# Patient Record
Sex: Male | Born: 1945 | Race: White | Hispanic: No | Marital: Married | Smoking: Former smoker
Health system: Southern US, Community
[De-identification: ages and names within clinical notes are randomized; demographics above are authoritative.]

## PROBLEM LIST (undated history)

## (undated) DIAGNOSIS — E119 Type 2 diabetes mellitus without complications: Secondary | ICD-10-CM

## (undated) DIAGNOSIS — M109 Gout, unspecified: Secondary | ICD-10-CM

## (undated) DIAGNOSIS — I10 Essential (primary) hypertension: Secondary | ICD-10-CM

## (undated) DIAGNOSIS — F419 Anxiety disorder, unspecified: Secondary | ICD-10-CM

## (undated) DIAGNOSIS — K219 Gastro-esophageal reflux disease without esophagitis: Secondary | ICD-10-CM

## (undated) DIAGNOSIS — E7800 Pure hypercholesterolemia, unspecified: Secondary | ICD-10-CM

## (undated) DIAGNOSIS — I219 Acute myocardial infarction, unspecified: Secondary | ICD-10-CM

## (undated) DIAGNOSIS — C801 Malignant (primary) neoplasm, unspecified: Secondary | ICD-10-CM

## (undated) DIAGNOSIS — K30 Functional dyspepsia: Secondary | ICD-10-CM

## (undated) DIAGNOSIS — K449 Diaphragmatic hernia without obstruction or gangrene: Secondary | ICD-10-CM

## (undated) DIAGNOSIS — F32A Depression, unspecified: Secondary | ICD-10-CM

## (undated) DIAGNOSIS — N400 Enlarged prostate without lower urinary tract symptoms: Secondary | ICD-10-CM

## (undated) DIAGNOSIS — Z87442 Personal history of urinary calculi: Secondary | ICD-10-CM

## (undated) HISTORY — DX: Depression, unspecified: F32.A

## (undated) HISTORY — DX: Essential (primary) hypertension: I10

## (undated) HISTORY — DX: Anxiety disorder, unspecified: F41.9

## (undated) HISTORY — DX: Malignant (primary) neoplasm, unspecified: C80.1

## (undated) HISTORY — DX: Diaphragmatic hernia without obstruction or gangrene: K44.9

## (undated) HISTORY — DX: Type 2 diabetes mellitus without complications: E11.9

## (undated) HISTORY — DX: Gout, unspecified: M10.9

## (undated) HISTORY — DX: Pure hypercholesterolemia, unspecified: E78.00

## (undated) HISTORY — DX: Acute myocardial infarction, unspecified: I21.9

## (undated) HISTORY — PX: KIDNEY STONE SURGERY: SHX686

## (undated) HISTORY — DX: Functional dyspepsia: K30

## (undated) HISTORY — DX: Personal history of urinary calculi: Z87.442

## (undated) HISTORY — DX: Benign prostatic hyperplasia without lower urinary tract symptoms: N40.0

## (undated) HISTORY — DX: Gastro-esophageal reflux disease without esophagitis: K21.9

---

## 2003-11-13 ENCOUNTER — Other Ambulatory Visit: Payer: Self-pay

## 2004-02-01 DIAGNOSIS — K30 Functional dyspepsia: Secondary | ICD-10-CM

## 2004-02-01 HISTORY — DX: Functional dyspepsia: K30

## 2004-07-04 ENCOUNTER — Ambulatory Visit: Payer: Self-pay | Admitting: Internal Medicine

## 2004-07-07 ENCOUNTER — Ambulatory Visit: Payer: Self-pay | Admitting: Internal Medicine

## 2004-07-07 ENCOUNTER — Ambulatory Visit (HOSPITAL_COMMUNITY): Admission: RE | Admit: 2004-07-07 | Discharge: 2004-07-07 | Payer: Self-pay | Admitting: Internal Medicine

## 2004-07-07 HISTORY — PX: COLONOSCOPY: SHX174

## 2004-07-07 HISTORY — PX: ESOPHAGOGASTRODUODENOSCOPY: SHX1529

## 2004-07-27 ENCOUNTER — Ambulatory Visit: Payer: Self-pay | Admitting: Internal Medicine

## 2004-10-06 ENCOUNTER — Ambulatory Visit: Payer: Self-pay | Admitting: Internal Medicine

## 2005-06-15 ENCOUNTER — Ambulatory Visit: Payer: Self-pay | Admitting: Internal Medicine

## 2006-06-19 ENCOUNTER — Ambulatory Visit: Payer: Self-pay | Admitting: Internal Medicine

## 2007-03-11 ENCOUNTER — Ambulatory Visit: Payer: Self-pay | Admitting: Internal Medicine

## 2007-03-13 ENCOUNTER — Ambulatory Visit: Payer: Self-pay | Admitting: General Surgery

## 2007-03-18 ENCOUNTER — Ambulatory Visit: Payer: Self-pay | Admitting: General Surgery

## 2007-03-28 ENCOUNTER — Ambulatory Visit: Payer: Self-pay | Admitting: General Surgery

## 2007-03-28 ENCOUNTER — Other Ambulatory Visit: Payer: Self-pay

## 2007-03-29 ENCOUNTER — Ambulatory Visit: Payer: Self-pay | Admitting: General Surgery

## 2007-04-06 ENCOUNTER — Ambulatory Visit: Payer: Self-pay | Admitting: Oncology

## 2007-04-17 ENCOUNTER — Ambulatory Visit: Payer: Self-pay | Admitting: Oncology

## 2007-05-06 ENCOUNTER — Ambulatory Visit: Payer: Self-pay | Admitting: Oncology

## 2007-06-06 ENCOUNTER — Ambulatory Visit: Payer: Self-pay | Admitting: Oncology

## 2007-07-07 ENCOUNTER — Ambulatory Visit: Payer: Self-pay | Admitting: Oncology

## 2007-08-04 ENCOUNTER — Ambulatory Visit: Payer: Self-pay | Admitting: Oncology

## 2007-09-04 ENCOUNTER — Ambulatory Visit: Payer: Self-pay | Admitting: Oncology

## 2007-09-16 ENCOUNTER — Inpatient Hospital Stay: Payer: Self-pay | Admitting: Unknown Physician Specialty

## 2007-10-04 ENCOUNTER — Ambulatory Visit: Payer: Self-pay | Admitting: Oncology

## 2007-12-04 ENCOUNTER — Ambulatory Visit: Payer: Self-pay | Admitting: Oncology

## 2007-12-12 ENCOUNTER — Ambulatory Visit: Payer: Self-pay | Admitting: Oncology

## 2007-12-25 ENCOUNTER — Ambulatory Visit: Payer: Self-pay | Admitting: Oncology

## 2008-01-04 ENCOUNTER — Ambulatory Visit: Payer: Self-pay | Admitting: Oncology

## 2008-03-05 ENCOUNTER — Ambulatory Visit: Payer: Self-pay | Admitting: Oncology

## 2008-03-09 IMAGING — NM NM  CARDIAC MUGA REST SCAN 2 0F 2
4 series · 24 of 24 positions shown · non-contrast
Comparison: none

REASON FOR EXAM: lymphoma   pre chemo
COMMENTS:

[Series 1000: lao 70-gated · 6.59mm/px · 6 of 24 frames shown]
[frame 3/24]
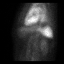
[frame 7/24]
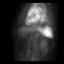
[frame 11/24]
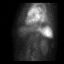
[frame 15/24]
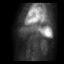
[frame 19/24]
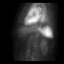
[frame 23/24]
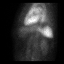

[Series 1000: lao 45-gated · 6.59mm/px · 6 of 24 frames shown]
[frame 3/24]
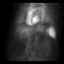
[frame 7/24]
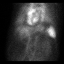
[frame 11/24]
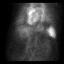
[frame 15/24]
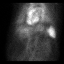
[frame 19/24]
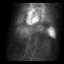
[frame 23/24]
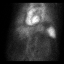

[Series 1000: lao 45-gated (results) · 6.59mm/px · 6 of 24 frames shown]
[frame 3/24]
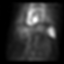
[frame 7/24]
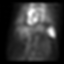
[frame 11/24]
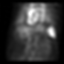
[frame 15/24]
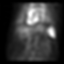
[frame 19/24]
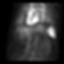
[frame 23/24]
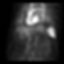

[Series 1000: ant-gated · 6.59mm/px · 6 of 24 frames shown]
[frame 3/24]
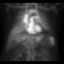
[frame 7/24]
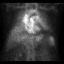
[frame 11/24]
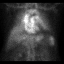
[frame 15/24]
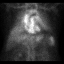
[frame 19/24]
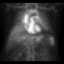
[frame 23/24]
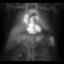

[24 of 24 positions shown; findings below may reference images not displayed]

PROCEDURE:     NM  - NM REST MUGA SCAN [DATE] OF [DATE]  - [DATE] [DATE] [DATE]  [DATE]

RESULT:     Following intravenous administration of 3.0 milliliters PYP and
25.14 mCi Tc 99m pertechnetate, Rest MUGA scan was performed. Review of the
LEFT ventricular cine loop shows no akinetic or dyskinetic myocardial
segments. The LEFT ventricular ejection fraction measures 59.1% which is in
the normal range.
IMPRESSION: 1.     Normal study. The LEFT ventricular ejection fraction measures 59.1%.

## 2008-04-02 ENCOUNTER — Ambulatory Visit: Payer: Self-pay | Admitting: Oncology

## 2008-04-05 ENCOUNTER — Ambulatory Visit: Payer: Self-pay | Admitting: Oncology

## 2008-06-05 ENCOUNTER — Ambulatory Visit: Payer: Self-pay | Admitting: Oncology

## 2008-06-26 ENCOUNTER — Ambulatory Visit: Payer: Self-pay | Admitting: Oncology

## 2008-07-02 ENCOUNTER — Ambulatory Visit: Payer: Self-pay | Admitting: Oncology

## 2008-07-06 ENCOUNTER — Ambulatory Visit: Payer: Self-pay | Admitting: Oncology

## 2008-12-03 ENCOUNTER — Ambulatory Visit: Payer: Self-pay | Admitting: Oncology

## 2008-12-11 ENCOUNTER — Ambulatory Visit: Payer: Self-pay | Admitting: Oncology

## 2008-12-16 ENCOUNTER — Ambulatory Visit: Payer: Self-pay | Admitting: Oncology

## 2009-01-03 ENCOUNTER — Ambulatory Visit: Payer: Self-pay | Admitting: Oncology

## 2010-01-11 ENCOUNTER — Emergency Department: Payer: Self-pay | Admitting: Emergency Medicine

## 2010-06-09 ENCOUNTER — Ambulatory Visit: Payer: Self-pay | Admitting: Endocrinology

## 2011-06-06 HISTORY — PX: STAPLE HEMORRHOIDECTOMY: SHX2438

## 2011-11-17 ENCOUNTER — Ambulatory Visit: Payer: Self-pay | Admitting: Endocrinology

## 2012-01-12 ENCOUNTER — Encounter: Payer: Self-pay | Admitting: Internal Medicine

## 2012-01-15 ENCOUNTER — Ambulatory Visit (INDEPENDENT_AMBULATORY_CARE_PROVIDER_SITE_OTHER): Payer: BC Managed Care – PPO | Admitting: Gastroenterology

## 2012-01-15 ENCOUNTER — Encounter: Payer: Self-pay | Admitting: Gastroenterology

## 2012-01-15 VITALS — BP 123/76 | HR 67 | Temp 98.6°F | Ht 73.0 in | Wt 179.0 lb

## 2012-01-15 DIAGNOSIS — K900 Celiac disease: Secondary | ICD-10-CM

## 2012-01-15 DIAGNOSIS — K219 Gastro-esophageal reflux disease without esophagitis: Secondary | ICD-10-CM

## 2012-01-15 DIAGNOSIS — R1310 Dysphagia, unspecified: Secondary | ICD-10-CM

## 2012-01-15 MED ORDER — PEG 3350-KCL-NA BICARB-NACL 420 G PO SOLR: 4000.0000 L | ORAL | Status: AC

## 2012-01-15 MED ORDER — DICYCLOMINE HCL 10 MG PO CAPS: 10.0000 mg | ORAL_CAPSULE | Freq: Three times a day (TID) | ORAL | Status: DC

## 2012-01-15 NOTE — Progress Notes (Signed)
Primary Care Physician:  Alan Mulder, MD Primary Gastroenterologist:  Dr. Jena Gauss  Chief Complaint  Patient presents with  . Diarrhea  . 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 Last seen in 2008. Last colonoscopy 2006.  Has flare ups of diarrhea. Monday, Tues, Wed. Second day yellow, first day soft, third soft then followed by pure water. Drives an hour to work. +urgency. +postprandial. Imodium makes feel like zombie. Notes spot RLQ, RUQ, epigasric, LUQ that can feel movement. "aware" of it. 4 years ago diagnosed with some type of cancer. Last 4 days done great. +reflux, new, has "sore stomach", feels in mouth and throat. Started taking Pepto a few days ago. Upper abdomen feels irritated. +dysphagia with food and pills. X 1 year. Hx of EGD with empiric dilation 2006.   No blood in stool. Trying to follow gluten free as much as possible. Not frequent diarrhea until last week. No recent abx. No changes in medication. Blood work done 2 weeks ago with Dr. Patrecia Pace. Doesn't eat a lot when working, only eats jello because afraid of what is going to happen with bowel habits. 1 drink a day at work, 1 bag of potato chips. No blood in stool. At home beans, baked potatoes. After had cancer, had lost interest in everything. Lost wt from 224 to 179. Over time. Used to weigh 300 lbs. States r/t diarrhea.   Past Medical History  Diagnosis Date  . HTN (hypertension)   . Hypercholesterolemia   . DM (diabetes mellitus)     resolved  . GERD (gastroesophageal reflux disease)   . BPH (benign prostatic hyperplasia)   . History of kidney stones   . Gouty arthritis   . Hiatal hernia   . 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 Heart attack   . Cancer     Past Surgical History  Procedure Date  . Esophagogastroduodenoscopy  07/07/04    normal/small HH/otherwise normal  . Colonoscopy 07/07/04    normal/left-side diverticula  . Kidney stone surgery     Current Outpatient Prescriptions  Medication Sig Dispense Refill  . aspirin 81 MG tablet Take 81 mg by mouth daily.      . clonazePAM (KLONOPIN) 0.5 MG tablet Take 0.5 mg by mouth at bedtime as needed.       . clopidogrel (PLAVIX) 75 MG tablet Take 75 mg by mouth daily.       Marland Kitchen esomeprazole (NEXIUM) 40 MG capsule Take 40 mg by mouth daily before breakfast.      . folic acid (FOLVITE) 1 MG tablet Take 1 mg by mouth daily.       . meloxicam (MOBIC) 7.5 MG tablet Take 7.5 mg by mouth daily.       . metoprolol succinate (TOPROL-XL) 50 MG 24 hr tablet Take 25 mg by mouth daily.       . ranitidine (ZANTAC) 150 MG capsule Take 150 mg by mouth 2 (two) times daily.      . Tamsulosin HCl (FLOMAX) 0.4 MG CAPS Take 0.4 mg by mouth daily.       . Vitamin D, Ergocalciferol, (DRISDOL) 50000 UNITS CAPS Take 50,000 Units by mouth every 7 (seven) days.       Marland Kitchen dicyclomine (BENTYL) 10 MG capsule Take 1 capsule (10 mg total) by mouth 4 (four)  times daily -  before meals and at bedtime.  90 capsule  3  . polyethylene glycol-electrolytes (TRILYTE) 420 G solution Take 4,000,000 mLs by mouth as directed.  4000 mL  0    Allergies as of 01/15/2012  . (No Known Allergies)    Family History  Problem Relation Age of Onset  . Colon cancer Neg Hx     History   Social History  . Marital Status: Married    Spouse Name: N/A    Number of Children: N/A  . Years of Education: N/A   Occupational History  . [employer]   Social History Main Topics  . Smoking status: Never Smoker   . Smokeless tobacco: Not on file  . Alcohol Use: No  . Drug Use: No  . Sexually Active: Not on file   Other Topics Concern  . Not on file   Social History Narrative  . No narrative on file    Review of Systems: Gen: SEE HPI CV: Denies chest pain, heart palpitations, peripheral  edema, syncope.  Resp: Denies shortness of breath at rest or with exertion. Denies wheezing or cough.  GI: SEE HPI GU : Denies urinary burning, urinary frequency, urinary hesitancy MS: Denies joint pain, muscle weakness, cramps, or limitation of movement.  Derm: Denies rash, itching, dry skin Psych: Denies depression, anxiety, memory loss, and confusion Heme: Denies bruising, bleeding, and enlarged lymph nodes.  Physical Exam: BP 123/76  Pulse 67  Temp 98.6 F (37 C) (Temporal)  Ht 6\' 1"  (1.854 m)  Wt 179 lb (81.194 kg)  BMI 23.62 kg/m2 General:   Alert and oriented. Pleasant and cooperative. Well-nourished and well-developed.  Head:  Normocephalic and atraumatic. Eyes:  Without icterus, sclera clear and conjunctiva pink.  Ears:  Normal auditory acuity. Nose:  No deformity, discharge,  or lesions. Mouth:  No deformity or lesions, oral mucosa pink.  Neck:  Supple, without mass or thyromegaly. Lungs:  Clear to auscultation bilaterally. No wheezes, rales, or rhonchi. No distress.  Heart:  S1, S2 present without murmurs appreciated.  Abdomen:  +BS, soft, non-tender and non-distended. No HSM noted. No guarding or rebound. No masses appreciated.  Rectal:  Deferred  Msk:  Symmetrical without gross deformities. Normal posture. Extremities:  Without clubbing or edema. Neurologic:  Alert and  oriented x4;  grossly normal neurologically. Skin:  Intact without significant lesions or rashes. Cervical Nodes:  No significant cervical adenopathy. Psych:  Alert and cooperative. Normal mood and affect.

## 2012-01-15 NOTE — Patient Instructions (Addendum)
Restart Nexium. I have also sent a prescription for Bentyl to take before meals and at bedtime as needed for spasms, loose stools.   Please complete the stool sample and return when/if possible  Please fax the lab results to your office to fax# [phone]. We will likely need to do some more blood work.    You have been set up for an upper endoscopy with Dr. Jena Gauss. We are tentatively planning on a colonoscopy as well. I will let you know as soon as the blood work is completed.

## 2012-01-16 ENCOUNTER — Encounter: Payer: BC Managed Care – PPO | Admitting: Gastroenterology

## 2012-01-16 ENCOUNTER — Encounter: Payer: Self-pay | Admitting: Internal Medicine

## 2012-01-16 ENCOUNTER — Other Ambulatory Visit: Payer: Self-pay | Admitting: Internal Medicine

## 2012-01-16 DIAGNOSIS — R197 Diarrhea, unspecified: Secondary | ICD-10-CM

## 2012-01-16 DIAGNOSIS — K900 Celiac disease: Secondary | ICD-10-CM

## 2012-01-17 ENCOUNTER — Telehealth: Payer: Self-pay | Admitting: Gastroenterology

## 2012-01-17 ENCOUNTER — Encounter: Payer: Self-pay | Admitting: Gastroenterology

## 2012-01-17 DIAGNOSIS — R1310 Dysphagia, unspecified: Secondary | ICD-10-CM | POA: Insufficient documentation

## 2012-01-17 DIAGNOSIS — K900 Celiac disease: Secondary | ICD-10-CM | POA: Insufficient documentation

## 2012-01-17 DIAGNOSIS — K219 Gastro-esophageal reflux disease without esophagitis: Secondary | ICD-10-CM | POA: Insufficient documentation

## 2012-01-17 NOTE — Assessment & Plan Note (Signed)
Reflux, "sour stomach", dyspepsia and dysphagia. Remote hx of EGD in 2006 with empiric dilation. No PPI currently. Symptoms X 1 year. Restart Nexium, plan on EGD at time of TCS, dilation as appropriate.   Proceed with upper endoscopy and dilation, small bowel biopsies as well in the near future with Dr. Jena Gauss. The risks, benefits, and alternatives have been discussed in detail with patient. They have stated understanding and desire to proceed.  Resume Nexium

## 2012-01-17 NOTE — Telephone Encounter (Signed)
Please let pt know we definitely DO need to proceed with a colonoscopy.  I had said tentative, but I'd like this done at time of EGD as well. Thanks!

## 2012-01-17 NOTE — Assessment & Plan Note (Addendum)
66 year old male with presumed celiac disease based on positive serologies but no prior small bowel biopsies, now with flares of diarrhea, postprandial urgency, abdominal pain despite reportedly strictly adhering to gluten-free diet. Denies abx, recent changes to medication. Decreased po intake due to fear of diarrhea. Wt 224 in 2008, now 179. Last colonoscopy 2006, normal. Diagnosed with some type of cancer about 4 years ago; will need to request notes from PCP and most recent labs. Due to supposed adherence to diet yet continued diarrhea, will need upper endoscopy with small bowel biopsies and colonoscopy for further evaluation; may ultimately need small bowel imaging as well.   Proceed with TCS and endoscopy with Dr. Jena Gauss in near future: the risks, benefits, and alternatives have been discussed with the patient in detail. The patient states understanding and desires to proceed. Restart Bentyl; pt has been on this in remote past Obtain labs from PCP DEXA scan if not up-to-date Anticipate further labs after review from PCP

## 2012-01-17 NOTE — Assessment & Plan Note (Signed)
Last dilation 2006. EGD at time of TCS.

## 2012-01-18 ENCOUNTER — Other Ambulatory Visit: Payer: Self-pay | Admitting: Internal Medicine

## 2012-01-18 ENCOUNTER — Telehealth: Payer: Self-pay | Admitting: *Deleted

## 2012-01-18 DIAGNOSIS — K900 Celiac disease: Secondary | ICD-10-CM

## 2012-01-18 DIAGNOSIS — R1310 Dysphagia, unspecified: Secondary | ICD-10-CM

## 2012-01-18 NOTE — Telephone Encounter (Signed)
Thomas Monroe called today for Thomas Monroe, returning a call. She will be available after 4 pm at her home number for you to call her back on. Thanks.

## 2012-01-18 NOTE — Progress Notes (Signed)
Faxed to PCP

## 2012-01-18 NOTE — Telephone Encounter (Signed)
Forward to [employer]. To get him set up

## 2012-01-19 NOTE — Telephone Encounter (Signed)
Done

## 2012-01-30 ENCOUNTER — Telehealth: Payer: Self-pay | Admitting: Internal Medicine

## 2012-01-30 NOTE — Telephone Encounter (Signed)
Disregard- ERROR

## 2012-01-30 NOTE — Telephone Encounter (Signed)
Patient had to cancel his procedure w/RMR he is having hemorrhoid surgery by the surgeon in [facility] Dr. Burton Apley

## 2012-01-31 ENCOUNTER — Ambulatory Visit: Payer: Self-pay | Admitting: General Surgery

## 2012-01-31 LAB — CBC WITH DIFFERENTIAL/PLATELET
Basophil %: 0.5 %
Eosinophil %: 2 %
HCT: 39.7 % — ABNORMAL LOW (ref 40.0–52.0)
Lymphocyte #: 1.4 10*3/uL (ref 1.0–3.6)
MCH: 29.9 pg (ref 26.0–34.0)
MCV: 91 fL (ref 80–100)
Monocyte %: 6.1 %
Neutrophil #: 5.3 10*3/uL (ref 1.4–6.5)
RBC: 4.34 10*6/uL — ABNORMAL LOW (ref 4.40–5.90)
WBC: 7.3 10*3/uL (ref 3.8–10.6)

## 2012-01-31 LAB — BASIC METABOLIC PANEL
Anion Gap: 8 (ref 7–16)
BUN: 16 mg/dL (ref 7–18)
Creatinine: 0.98 mg/dL (ref 0.60–1.30)
EGFR (African American): >60
EGFR (Non-African Amer.): >60
Glucose: 90 mg/dL (ref 65–99)
Osmolality: 278 (ref 275–301)

## 2012-02-06 ENCOUNTER — Ambulatory Visit: Payer: Self-pay | Admitting: General Surgery

## 2012-02-09 LAB — PATHOLOGY REPORT

## 2012-02-12 ENCOUNTER — Encounter (HOSPITAL_COMMUNITY): Admission: RE | Payer: Self-pay | Source: Ambulatory Visit

## 2012-02-12 ENCOUNTER — Ambulatory Visit (HOSPITAL_COMMUNITY)
Admission: RE | Admit: 2012-02-12 | Payer: BC Managed Care – PPO | Source: Ambulatory Visit | Admitting: Internal Medicine

## 2012-02-12 SURGERY — COLONOSCOPY
Anesthesia: Moderate Sedation

## 2012-02-12 SURGERY — ESOPHAGOGASTRODUODENOSCOPY (EGD) WITH ESOPHAGEAL DILATION
Anesthesia: Moderate Sedation

## 2012-05-22 ENCOUNTER — Emergency Department: Payer: Self-pay | Admitting: Emergency Medicine

## 2012-09-04 ENCOUNTER — Observation Stay: Payer: Self-pay | Admitting: Internal Medicine

## 2012-09-04 LAB — CBC
HGB: 12.5 g/dL — ABNORMAL LOW (ref 13.0–18.0)
MCH: 30 pg (ref 26.0–34.0)
MCHC: 32.6 g/dL (ref 32.0–36.0)
RBC: 4.17 10*6/uL — ABNORMAL LOW (ref 4.40–5.90)
RDW: 13.8 % (ref 11.5–14.5)
WBC: 9.8 10*3/uL (ref 3.8–10.6)

## 2012-09-04 LAB — URINALYSIS, COMPLETE
Bacteria: NONE SEEN
Ketone: NEGATIVE
Leukocyte Esterase: NEGATIVE
RBC,UR: <1 /HPF (ref 0–5)
Squamous Epithelial: NONE SEEN

## 2012-09-04 LAB — DRUG SCREEN, URINE
Benzodiazepine, Ur Scrn: NEGATIVE (ref ?–200)
Cannabinoid 50 Ng, Ur ~~LOC~~: NEGATIVE (ref ?–50)
Cocaine Metabolite,Ur ~~LOC~~: NEGATIVE (ref ?–300)
MDMA (Ecstasy)Ur Screen: NEGATIVE (ref ?–500)
Opiate, Ur Screen: NEGATIVE (ref ?–300)
Tricyclic, Ur Screen: NEGATIVE (ref ?–1000)

## 2012-09-04 LAB — CK TOTAL AND CKMB (NOT AT ARMC)
CK, Total: 71 U/L (ref 35–232)
CK-MB: <0.5 ng/mL — ABNORMAL LOW (ref 0.5–3.6)

## 2012-09-04 LAB — BASIC METABOLIC PANEL
Calcium, Total: 8 mg/dL — ABNORMAL LOW (ref 8.5–10.1)
Creatinine: 1.05 mg/dL (ref 0.60–1.30)
EGFR (African American): >60
EGFR (Non-African Amer.): >60
Osmolality: 281 (ref 275–301)
Potassium: 4.5 mmol/L (ref 3.5–5.1)

## 2012-09-04 LAB — PROTIME-INR
INR: 1.2
Prothrombin Time: 15.4 secs — ABNORMAL HIGH (ref 11.5–14.7)

## 2012-09-04 LAB — TROPONIN I: Troponin-I: <0.02 ng/mL

## 2012-09-05 DIAGNOSIS — R079 Chest pain, unspecified: Secondary | ICD-10-CM

## 2012-09-05 LAB — CK TOTAL AND CKMB (NOT AT ARMC)
CK, Total: 65 U/L (ref 35–232)
CK-MB: <0.5 ng/mL — ABNORMAL LOW (ref 0.5–3.6)

## 2012-09-05 LAB — TROPONIN I: Troponin-I: <0.02 ng/mL

## 2013-02-27 NOTE — Progress Notes (Signed)
Patient cancelled procedures. Please send letter for follow-up.

## 2013-02-28 ENCOUNTER — Encounter: Payer: Self-pay | Admitting: Internal Medicine

## 2013-02-28 NOTE — Progress Notes (Signed)
Mailed letter °

## 2014-04-24 ENCOUNTER — Ambulatory Visit: Payer: Self-pay | Admitting: Endocrinology

## 2014-09-22 NOTE — Op Note (Signed)
PATIENT NAME:  Thomas Monroe, Thomas Monroe MR#:  [id number] DATE OF BIRTH:  12-25-1945  DATE OF PROCEDURE:  02/06/2012  PREOPERATIVE DIAGNOSIS: Prolapsing hemorrhoids.   POSTOPERATIVE DIAGNOSIS: Prolapsing hemorrhoids.  PROCEDURES:  1. Colonoscopy.  2. Internal hemorrhoidectomy.   SURGEON: Mckinley Jewel, M.D.   ANESTHESIA: General.   COMPLICATIONS: None.   ESTIMATED BLOOD LOSS: Less than 20 mL.   DRAINS: None.   CLINICAL NOTE: This patient had presented with complaints of a significant amount of tissue prolapsing every time he moved his bowels. Although this was reported by him on two visits to the office I was not able to detect any prolapsing tissue. He did have a couple of small internal hemorrhoids that were visible. We decided therefore to proceed with exam under anesthesia. In addition, the patient had not had a screening colonoscopy and this was planned to be done at the same time.   DESCRIPTION OF PROCEDURE: The patient was given propofol anesthesia  after placing him in the left lateral position. The colonoscope was then introduced into the rectum and advanced gradually through the sigmoid and into the descending colon and subsequently in the transverse and right colon. The patient had a fairly decent prep extending up and to the distal transverse colon, but from there on he seemed to have a moderate coating of stool which could not be easily washed out. This was all the way down to the cecal area. The findings included a 3 mm sessile polyp in the mid rectal region which was hot biopsied, also multiple large and small diverticula were noted in the sigmoid colon. No gross lesions suspicious of cancer or growth or mass was identified, although with the poor prep in the proximal half of the colon small lesions could have been missed. The scope was withdrawn.  The patient was then placed in the lithotomy position with a LMA in place at this time. It was noted that the patient had significant prolapse  of the skin around the anal area. The anal area was prepped and draped out as a sterile field and further evaluation was done both with digital examination and with anal speculum. It was noted there was a significant weakness of the internal sphincter which seemed to widen more than two fingerbreadths in size and it appeared that the tendency for prolapse was not only the mucosa but may even involved part of the rectal muscle. With this in mind, a stapling technique was not utilized. The redundant mucosa was inspected and noted there were in fact two hemorrhoids, one posteriorly and one anteriorly. These were suture ligated with 2-0 Vicryl, 3-0 Vicryl, and removed. The area was covered with bacitracin ointment. The patient subsequently was taken down from lithotomy position and subsequently reversed and returned to the recovery room in stable condition.  ____________________________ Thomas Haines, Thomas Monroe sgs:slb D: 02/06/2012 15:40:27 ET T: 02/06/2012 16:38:42 ET JOB#: [id number]  cc: S.G. Jamal Collin, Thomas Monroe, <Dictator> [facility] Robinette Haines Thomas Monroe ELECTRONICALLY SIGNED 02/07/2012 7:49

## 2014-09-25 NOTE — H&P (Signed)
PATIENT NAME:  Thomas Monroe, Thomas Monroe MR#:  [id number] DATE OF BIRTH:  1945-11-21  DATE OF ADMISSION:  09/04/2012  PRIMARY CARE PHYSICIAN: Dr. Ronnald Collum.   CHIEF COMPLAINT: Chest pain.   HISTORY OF PRESENT ILLNESS:  A 69 year old male patient with history of coronary artery disease, diabetes, hypertension, presents to the Emergency Room with complaints of acute onset of chest pain while driving. The patient had midsternal chest pain lasting about 25 minutes along with associated vague heaviness in his neck and bilateral arms. This was associated with nausea and palpitations. No shortness of breath, lightheadedness, syncope. Never had similar symptoms in the past. He had a stress test 2 years prior.   The patient's EKG shows no acute changes. Cardiac enzymes are normal. Presently, he is chest pain-free and is being admitted to the hospitalist service to rule out acute coronary syndrome.   PAST MEDICAL HISTORY:  Depression, anxiety, hypertension, diabetes, coronary artery disease.   SOCIAL HISTORY: The patient does not smoke. No alcohol. No illicit drugs. Works in [place] [employer].   CODE STATUS: Full code.   FAMILY HISTORY: No premature coronary artery disease.   ALLERGIES: BARLEY, RYE AND WHEAT.  HOME MEDICATIONS: 1.  Aspirin 81 mg oral once a day.  2.  Plavix 75 mg oral once a day.  4.  Folic acid 1 mg oral once a day.  5.  Clonazepam 0.5 mg oral 3 times a day as needed for anxiety.  6.  Folic acid 1 mg oral once a day.  7.  Vitamin D3 1000 international units oral once a day.   REVIEW OF SYSTEMS: CONSTITUTIONAL: No fever, fatigue, weakness, weight loss, weight gain.  EYES: No blurred vision, pain, or redness.  ENT: No tinnitus, ear pain, hearing loss.  RESPIRATORY: No cough, shortness of breath, wheezing, hemoptysis.  GASTROINTESTINAL: No nausea, vomiting, diarrhea, abdominal pain.  CARDIOVASCULAR: Has chest pain, no orthopnea or edema.  GENITOURINARY: No dysuria, frequency, hematuria.   MUSCULOSKELETAL: No joint swelling, redness.  SKIN: No petechiae, rash or ulcers.  NEUROLOGIC: No focal numbness, weakness or seizures.  PSYCHIATRIC: Has anxiety, depression.   PHYSICAL EXAMINATION: VITAL SIGNS: Temperature 98.1, pulse 64, respirations 18, blood pressure 139/84, saturating 99% on room air.  GENERAL: Obese, Caucasian male patient lying in bed, comfortable, conversational, cooperative with exam.  PSYCHIATRIC: Alert, oriented x 3. Mood and affect appropriate. Judgment intact.  HEENT: Atraumatic, normocephalic. Oral mucosa moist and pink. External ears and nose normal. No pallor or icterus. Pupils bilaterally equal and react to light.  NECK: Supple. No thyromegaly. No palpable lymph nodes. Trachea midline. No carotid bruit or JVD.  CARDIOVASCULAR: S1, S2, regular rate and rhythm without any murmurs. No chest wall tenderness.  RESPIRATORY: Normal work of breathing. Clear to auscultation on both sides.  GASTROINTESTINAL: Soft abdomen, nontender. Bowel sounds present. No hepatosplenomegaly palpable.  GENITOURINARY: No CVA tenderness or bladder distention.  SKIN: Warm and dry. No petechiae, rash, ulcers.  MUSCULOSKELETAL: No joint swelling, redness, effusion of the large joints. Normal muscle tone.  NEUROLOGICAL: Motor strength 5/5 in upper and lower extremities. Sensation to fine touch intact all over.  LYMPHATIC: No cervical lymphadenopathy.   LABORATORY DATA AND IMAGING: 1.  CK, troponin and MB normal. WBC 9.8, hemoglobin 12.5. BUN 13, creatinine 1.05. INR 1.2. Urinalysis shows no bacteria, no WBC.  2.  EKG shows normal sinus rhythm. No acute ST or T wave changes.  3.  Chest x-ray, portable, showed no acute abnormalities.   ASSESSMENT AND PLAN: 1.  Chest pain  with heaviness in bilateral arms, atypical but with history of hypertension, diabetes, coronary artery disease and prior stents. We will admit the patient to rule out acute coronary syndrome. Get 2 more sets of cardiac  enzymes. Schedule for a stress test in morning. The patient does have history of gastroesophageal reflux disease, which could have caused his symptoms if the stress test is negative. If the stress test is positive, the patient will need a repeat cardiac catheterization and cardiology consult. We will continue the aspirin, Plavix, statin, beta blocker the patient is on.  2.  Hypertension. Continue home medication.  3.  Diabetes mellitus. Sliding scale insulin, diabetic diet.  4.  Gastroesophageal reflux disease. Continue proton pump inhibitors.  5.  Deep vein thrombosis prophylaxis with Lovenox.   CODE STATUS: Full code.   TIME SPENT: Time spent today on this case was 65 minutes.   ____________________________ Leia Alf Daymein Nunnery, MD srs:cs D: 09/04/2012 14:58:00 ET T: 09/04/2012 15:18:36 ET JOB#: [id number]  cc: Alveta Heimlich R. Darvin Neighbours, MD, <Dictator> [name], MD  Neita Carp MD ELECTRONICALLY SIGNED 09/09/2012 15:00

## 2014-09-25 NOTE — Discharge Summary (Signed)
PATIENT NAME:  Thomas Monroe, Thomas Monroe MR#:  [id number] DATE OF BIRTH:  [birth year]  DATE OF ADMISSION:  09/04/2012 DATE OF DISCHARGE:  09/05/2012  PRIMARY CARE PHYSICIAN:  Dr. Francoise Schaumann.   DISCHARGE DIAGNOSES:   1.  Gastroesophageal reflux disease.  2.  Coronary artery disease.  3.  Hypertension.   IMAGING STUDIES DONE:  Include a Lexiscan stress test, showed no significant ischemia.  Did show a small region of fixed defect in the inferior, lateral and apical region consistent with old scar or MI.  EF was calculated at 73%.  Was read as overall low-risk scan by Dr. Rockey Situ.   ADMITTING HISTORY, PHYSICAL AND HOSPITAL COURSE:  Please see detailed H and P dictated previously.  In brief, a 69 year old male patient with history of hypertension, diet-controlled diabetes presented to the Emergency Room complaining of chest pain, was admitted to the hospitalist service to rule out acute coronary syndrome and for a stress test.  The patient had three sets of cardiac enzymes which returned in normal range.  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  The patient had his pain likely secondary from the GERD, was started on PPIs twice a day, did not have any further pain and is being discharged home to follow up with his primary care physician.   DISCHARGE MEDICATIONS: 1.  Folic acid 1 mg oral once a day. 2.  Clonazepam 0.5 mg oral 3 times a day as needed.  3.  Plavix 75 mg oral once a day.  4.  Metoprolol succinate 50 mg 1/2 tablet oral once a day.  5.  Vitamin D3 3000 international units oral once a day.  6.  Aspirin 81 mg oral once a day.  7.  Protonix 40 mg oral 2 times a day.  8.  Nitroglycerin 0.4 mg sublingual as needed for chest pain.   DISCHARGE INSTRUCTIONS:  Low-sodium, low carbohydrate diet.  Activity as tolerated.  Follow with PCP Dr. Francoise Schaumann in 1 to 2 weeks.   Time spent on day of discharge, in discharge activity was 32  minutes.    ____________________________ Thomas Alf Lael Pilch, MD srs:ea D: 09/06/2012 15:20:12 ET T: 09/06/2012 23:25:04 ET JOB#: [id number]  cc: Alveta Heimlich R. Markel Mergenthaler, MD, <Dictator> Dr. Audree Bane MD ELECTRONICALLY SIGNED 09/09/2012 15:00

## 2016-05-08 ENCOUNTER — Other Ambulatory Visit: Payer: Self-pay | Admitting: Endocrinology

## 2016-05-08 DIAGNOSIS — I2510 Atherosclerotic heart disease of native coronary artery without angina pectoris: Secondary | ICD-10-CM

## 2016-05-12 ENCOUNTER — Ambulatory Visit
Admission: RE | Admit: 2016-05-12 | Discharge: 2016-05-12 | Disposition: A | Discharge status: Home / Self Care | Payer: Medicare HMO | Source: Ambulatory Visit | Attending: Endocrinology | Admitting: Endocrinology

## 2016-05-12 DIAGNOSIS — E119 Type 2 diabetes mellitus without complications: Secondary | ICD-10-CM | POA: Insufficient documentation

## 2016-05-12 DIAGNOSIS — I10 Essential (primary) hypertension: Secondary | ICD-10-CM | POA: Diagnosis not present

## 2016-05-12 DIAGNOSIS — I081 Rheumatic disorders of both mitral and tricuspid valves: Secondary | ICD-10-CM | POA: Diagnosis not present

## 2016-05-12 DIAGNOSIS — I252 Old myocardial infarction: Secondary | ICD-10-CM | POA: Insufficient documentation

## 2016-05-12 DIAGNOSIS — K219 Gastro-esophageal reflux disease without esophagitis: Secondary | ICD-10-CM | POA: Diagnosis not present

## 2016-05-12 DIAGNOSIS — E7800 Pure hypercholesterolemia, unspecified: Secondary | ICD-10-CM | POA: Insufficient documentation

## 2016-05-12 DIAGNOSIS — I2510 Atherosclerotic heart disease of native coronary artery without angina pectoris: Secondary | ICD-10-CM | POA: Insufficient documentation

## 2016-05-12 DIAGNOSIS — K449 Diaphragmatic hernia without obstruction or gangrene: Secondary | ICD-10-CM | POA: Diagnosis not present

## 2016-05-12 NOTE — Progress Notes (Signed)
*  PRELIMINARY RESULTS* Echocardiogram 2D Echocardiogram has been performed.  Sherrie Sport 05/12/2016, 11:36 AM

## 2016-07-12 ENCOUNTER — Emergency Department: Payer: Medicare HMO

## 2016-07-12 ENCOUNTER — Encounter: Payer: Self-pay | Admitting: Emergency Medicine

## 2016-07-12 DIAGNOSIS — Z7982 Long term (current) use of aspirin: Secondary | ICD-10-CM | POA: Diagnosis not present

## 2016-07-12 DIAGNOSIS — Z79899 Other long term (current) drug therapy: Secondary | ICD-10-CM | POA: Insufficient documentation

## 2016-07-12 DIAGNOSIS — E119 Type 2 diabetes mellitus without complications: Secondary | ICD-10-CM | POA: Insufficient documentation

## 2016-07-12 DIAGNOSIS — I10 Essential (primary) hypertension: Secondary | ICD-10-CM | POA: Diagnosis not present

## 2016-07-12 DIAGNOSIS — Z87891 Personal history of nicotine dependence: Secondary | ICD-10-CM | POA: Diagnosis not present

## 2016-07-12 DIAGNOSIS — R05 Cough: Secondary | ICD-10-CM | POA: Diagnosis present

## 2016-07-12 DIAGNOSIS — J09X2 Influenza due to identified novel influenza A virus with other respiratory manifestations: Secondary | ICD-10-CM | POA: Insufficient documentation

## 2016-07-12 DIAGNOSIS — Z7984 Long term (current) use of oral hypoglycemic drugs: Secondary | ICD-10-CM | POA: Diagnosis not present

## 2016-07-12 LAB — INFLUENZA PANEL BY PCR (TYPE A & B)
INFLAPCR: POSITIVE — AB
INFLBPCR: NEGATIVE

## 2016-07-12 LAB — BASIC METABOLIC PANEL
ANION GAP: 10 (ref 5–15)
BUN: 16 mg/dL (ref 6–20)
CALCIUM: 8.8 mg/dL — AB (ref 8.9–10.3)
CO2: 26 mmol/L (ref 22–32)
Chloride: 98 mmol/L — ABNORMAL LOW (ref 101–111)
Creatinine, Ser: 1.56 mg/dL — ABNORMAL HIGH (ref 0.61–1.24)
GFR, EST AFRICAN AMERICAN: 51 mL/min — AB (ref >60)
GFR, EST NON AFRICAN AMERICAN: 44 mL/min — AB (ref >60)
GLUCOSE: 122 mg/dL — AB (ref 65–99)
Potassium: 4.5 mmol/L (ref 3.5–5.1)
SODIUM: 134 mmol/L — AB (ref 135–145)

## 2016-07-12 LAB — CBC
HCT: 42.6 % (ref 40.0–52.0)
HEMOGLOBIN: 14.2 g/dL (ref 13.0–18.0)
MCH: 29.9 pg (ref 26.0–34.0)
MCHC: 33.2 g/dL (ref 32.0–36.0)
MCV: 90.1 fL (ref 80.0–100.0)
Platelets: 218 10*3/uL (ref 150–440)
RBC: 4.73 MIL/uL (ref 4.40–5.90)
RDW: 13.5 % (ref 11.5–14.5)
WBC: 8.4 10*3/uL (ref 3.8–10.6)

## 2016-07-12 LAB — TROPONIN I

## 2016-07-12 NOTE — ED Triage Notes (Signed)
Pt comes into the ED via POV c/o flu like symptoms and a new onset of chest pain that started today.  Patient states the chest pain is on the right side.  Symptoms also include: cough, congestion, body aches, and headache.  Patient presents in NAD at this time with even and unlabored respirations.  States he has dizziness along with the chest pain.

## 2016-07-13 ENCOUNTER — Emergency Department
Admission: EM | Admit: 2016-07-13 | Discharge: 2016-07-13 | Disposition: A | Discharge status: Home / Self Care | Payer: Medicare HMO | Attending: Emergency Medicine | Admitting: Emergency Medicine

## 2016-07-13 DIAGNOSIS — R059 Cough, unspecified: Secondary | ICD-10-CM

## 2016-07-13 DIAGNOSIS — J101 Influenza due to other identified influenza virus with other respiratory manifestations: Secondary | ICD-10-CM

## 2016-07-13 DIAGNOSIS — R05 Cough: Secondary | ICD-10-CM

## 2016-07-13 DIAGNOSIS — R0789 Other chest pain: Secondary | ICD-10-CM

## 2016-07-13 MED ORDER — HYDROCOD POLST-CPM POLST ER 10-8 MG/5ML PO SUER: 5.0000 mL | Freq: Two times a day (BID) | ORAL | 0 refills | Status: DC

## 2016-07-13 MED ORDER — SODIUM CHLORIDE 0.9 % IV BOLUS (SEPSIS)
1000.0000 mL | Freq: Once | INTRAVENOUS | Status: AC
  Administered 2016-07-13: 1000 mL via INTRAVENOUS

## 2016-07-13 MED ORDER — HYDROCOD POLST-CPM POLST ER 10-8 MG/5ML PO SUER
5.0000 mL | Freq: Once | ORAL | Status: AC
  Administered 2016-07-13: 5 mL via ORAL
  Filled 2016-07-13: qty 5

## 2016-07-13 NOTE — Discharge Instructions (Signed)
1. You may take Tussionex as needed for cough. 2. Drink plenty of fluids daily. 3. Return to the ER for worsening symptoms, persistent vomiting, difficulty breathing or other concerns.

## 2016-07-13 NOTE — ED Provider Notes (Signed)
[facility] Emergency Department Provider Note   ____________________________________________   First MD Initiated Contact with Patient 07/13/16 0030     (approximate)  I have reviewed the triage vital signs and the nursing notes.   HISTORY  Chief Complaint Chest Pain and Generalized Body Aches    HPI Thomas Monroe is a 71 y.o. male who presents to the ED from home with a chief complaint of flulike symptoms. Reports a one-week history of cough productive of yellow sputum, congestion, body aches, headache, sore throat andright-sided chest pain. States he has been taking over-the-counter medications and thought he was improving but felt worse after working today. Denies associated fever, chills, abdominal pain, nausea, vomiting, diarrhea. Denies recent travel or trauma. Nothing makes his symptoms better or worse.   Past Medical History:  Diagnosis Date  . BPH (benign prostatic hyperplasia)   . Cancer ([place])   . 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 DM (diabetes mellitus) ([place])    resolved  . GERD (gastroesophageal reflux disease)   . Gouty arthritis   . Heart attack   . Hiatal hernia   . History of kidney stones   . HTN (hypertension)   . Hypercholesterolemia     Patient Active Problem List   Diagnosis Date Noted  . Celiac disease 01/17/2012  . GERD (gastroesophageal reflux disease) 01/17/2012  . Dysphagia 01/17/2012    Past Surgical History:  Procedure Laterality Date  . COLONOSCOPY  07/07/04   normal/left-side diverticula  . ESOPHAGOGASTRODUODENOSCOPY  07/07/04   normal/small HH/otherwise normal  . KIDNEY STONE SURGERY      Prior to Admission medications   Medication Sig Start Date End Date Taking? Authorizing Provider  aspirin 81 MG tablet Take 81 mg by mouth daily.    Historical Provider, MD  chlorpheniramine-HYDROcodone (TUSSIONEX PENNKINETIC ER) 10-8 MG/5ML SUER Take 5 mLs by mouth 2 (two)  times daily. 07/13/16   Paulette Blanch, MD  clonazePAM (KLONOPIN) 0.5 MG tablet Take 0.5 mg by mouth at bedtime as needed.  12/08/11   Historical Provider, MD  clopidogrel (PLAVIX) 75 MG tablet Take 75 mg by mouth daily.  12/27/11   Historical Provider, MD  dicyclomine (BENTYL) 10 MG capsule Take 1 capsule (10 mg total) by mouth 4 (four) times daily -  before meals and at bedtime. 01/15/12 01/14/13  Annitta Needs, NP  esomeprazole (NEXIUM) 40 MG capsule Take 40 mg by mouth daily before breakfast.    Historical Provider, MD  folic acid (FOLVITE) 1 MG tablet Take 1 mg by mouth daily.  01/05/12   Historical Provider, MD  meloxicam (MOBIC) 7.5 MG tablet Take 7.5 mg by mouth daily.  01/08/12   Historical Provider, MD  metoprolol succinate (TOPROL-XL) 50 MG 24 hr tablet Take 25 mg by mouth daily.  01/05/12   Historical Provider, MD  ranitidine (ZANTAC) 150 MG capsule Take 150 mg by mouth 2 (two) times daily.    Historical Provider, MD  Tamsulosin HCl (FLOMAX) 0.4 MG CAPS Take 0.4 mg by mouth daily.  01/05/12   Historical Provider, MD  Vitamin D, Ergocalciferol, (DRISDOL) 50000 UNITS CAPS Take 50,000 Units by mouth every 7 (seven) days.  01/05/12   Historical Provider, MD    Allergies Klonopin [clonazepam]  Family History  Problem Relation Age of Onset  . Colon cancer Neg Hx     Social History Social History  Substance Use Topics  . Smoking status: Former Research scientist (life sciences)  .  Smokeless tobacco: Never Used  . Alcohol use No    Review of Systems  Constitutional: No fever/chills. Eyes: No visual changes. ENT: Positive for nasal congestion and sore throat. Cardiovascular: Positive for chest pain. Respiratory: Positive for productive cough. Denies shortness of breath. Gastrointestinal: No abdominal pain.  No nausea, no vomiting.  No diarrhea.  No constipation. Genitourinary: Negative for dysuria. Musculoskeletal: Negative for back pain. Skin: Negative for rash. Neurological: Negative for headaches, focal weakness or  numbness.  10-point ROS otherwise negative.  ____________________________________________   PHYSICAL EXAM:  VITAL SIGNS: ED Triage Vitals  Enc Vitals Group     BP 07/12/16 2028 126/72     Pulse Rate 07/12/16 2028 87     Resp 07/12/16 2028 18     Temp 07/12/16 2028 98.5 F (36.9 C)     Temp Source 07/12/16 2028 Oral     SpO2 07/12/16 2028 99 %     Weight 07/12/16 2026 170 lb (77.1 kg)     Height 07/12/16 2026 6\' 1"  (1.854 m)     Head Circumference --      Peak Flow --      Pain Score 07/12/16 2026 6     Pain Loc --      Pain Edu? --      Excl. in [place]? --     Constitutional: Alert and oriented. Well appearing and in no acute distress. Eyes: Conjunctivae are normal. PERRL. EOMI. Head: Atraumatic. Nose: Congestion/rhinnorhea. Mouth/Throat: Mucous membranes are moist.  Oropharynx mildly erythematous without tonsillar swelling, exudates or peritonsillar abscess. Mild postnasal drip noted. There is no hoarse muffled voice. There is no drooling.  Neck: No stridor.  Supple neck without meningismus. Hematological/Lymphatic/Immunilogical: No cervical lymphadenopathy. Cardiovascular: Normal rate, regular rhythm. Grossly normal heart sounds.  Good peripheral circulation. Respiratory: Normal respiratory effort.  No retractions. Lungs CTAB. Right anterior chest wall tender to palpation. Gastrointestinal: Soft and nontender. No distention. No abdominal bruits. No CVA tenderness. Musculoskeletal: No lower extremity tenderness nor edema.  No joint effusions. Neurologic:  Normal speech and language. No gross focal neurologic deficits are appreciated. No gait instability. Skin:  Skin is warm, dry and intact. No rash noted. No petechiae. Psychiatric: Mood and affect are normal. Speech and behavior are normal.  ____________________________________________   LABS (all labs ordered are listed, but only abnormal results are displayed)  Labs Reviewed  BASIC METABOLIC PANEL - Abnormal; Notable  for the following:       Result Value   Sodium 134 (*)    Chloride 98 (*)    Glucose, Bld 122 (*)    Creatinine, Ser 1.56 (*)    Calcium 8.8 (*)    GFR calc non Af Amer 44 (*)    GFR calc Af Amer 51 (*)    All other components within normal limits  INFLUENZA PANEL BY PCR (TYPE A & B) - Abnormal; Notable for the following:    Influenza A By PCR POSITIVE (*)    All other components within normal limits  CBC  TROPONIN I   ____________________________________________  EKG  ED ECG REPORT I, SUNG,JADE J, the attending physician, personally viewed and interpreted this ECG.   Date: 07/13/2016  EKG Time: 2027  Rate: 85  Rhythm: normal EKG, normal sinus rhythm  Axis: LAD  Intervals:none  ST&T Change: Nonspecific  ____________________________________________  RADIOLOGY  Chest 2 view interpreted per Dr. Randel Pigg: No active cardiopulmonary disease. Tiny 3 mm density projecting over  the anterior left seventh rib may  represent a bone island,  potentially overlying subpleural tiny nodule or based on prior CT, a  tiny focus of pleural thickening.    ____________________________________________   PROCEDURES  Procedure(s) performed: None  Procedures  Critical Care performed: No  ____________________________________________   INITIAL IMPRESSION / ASSESSMENT AND PLAN / ED COURSE  Pertinent labs & imaging results that were available during my care of the patient were reviewed by me and considered in my medical decision making (see chart for details).  71 year old male who presents with a one-week history of flulike symptoms and pleurisy. Laboratory imaging results remarkable for renal insufficiency secondary to dehydration and positive influenza A. Will infuse IV fluids, Tussionex for cough and patient will follow-up with his PCP closely. Strict return precautions given. Patient verbalizes understanding and agrees with plan of care.        ____________________________________________   FINAL CLINICAL IMPRESSION(S) / ED DIAGNOSES  Final diagnoses:  Influenza A  Cough  Chest wall pain      NEW MEDICATIONS STARTED DURING THIS VISIT:  New Prescriptions   CHLORPHENIRAMINE-HYDROCODONE (TUSSIONEX PENNKINETIC ER) 10-8 MG/5ML SUER    Take 5 mLs by mouth 2 (two) times daily.     Note:  This document was prepared using Dragon voice recognition software and may include unintentional dictation errors.    Paulette Blanch, MD 07/13/16 [phone]

## 2017-10-28 ENCOUNTER — Encounter: Payer: Self-pay | Admitting: Emergency Medicine

## 2017-10-28 ENCOUNTER — Other Ambulatory Visit: Payer: Self-pay

## 2017-10-28 ENCOUNTER — Emergency Department
Admission: EM | Admit: 2017-10-28 | Discharge: 2017-10-28 | Disposition: A | Discharge status: Home / Self Care | Payer: Medicare HMO | Attending: Emergency Medicine | Admitting: Emergency Medicine

## 2017-10-28 DIAGNOSIS — Z7982 Long term (current) use of aspirin: Secondary | ICD-10-CM | POA: Diagnosis not present

## 2017-10-28 DIAGNOSIS — Z859 Personal history of malignant neoplasm, unspecified: Secondary | ICD-10-CM | POA: Insufficient documentation

## 2017-10-28 DIAGNOSIS — K0889 Other specified disorders of teeth and supporting structures: Secondary | ICD-10-CM | POA: Diagnosis present

## 2017-10-28 DIAGNOSIS — E119 Type 2 diabetes mellitus without complications: Secondary | ICD-10-CM | POA: Insufficient documentation

## 2017-10-28 DIAGNOSIS — Z87891 Personal history of nicotine dependence: Secondary | ICD-10-CM | POA: Insufficient documentation

## 2017-10-28 DIAGNOSIS — I10 Essential (primary) hypertension: Secondary | ICD-10-CM | POA: Insufficient documentation

## 2017-10-28 DIAGNOSIS — Z79899 Other long term (current) drug therapy: Secondary | ICD-10-CM | POA: Insufficient documentation

## 2017-10-28 DIAGNOSIS — K047 Periapical abscess without sinus: Secondary | ICD-10-CM | POA: Diagnosis not present

## 2017-10-28 DIAGNOSIS — Z7902 Long term (current) use of antithrombotics/antiplatelets: Secondary | ICD-10-CM | POA: Diagnosis not present

## 2017-10-28 DIAGNOSIS — K029 Dental caries, unspecified: Secondary | ICD-10-CM | POA: Diagnosis not present

## 2017-10-28 MED ORDER — TRAMADOL HCL 50 MG PO TABS: 50.0000 mg | ORAL_TABLET | Freq: Two times a day (BID) | ORAL | 0 refills | Status: DC

## 2017-10-28 MED ORDER — AMOXICILLIN 500 MG PO CAPS
500.0000 mg | ORAL_CAPSULE | Freq: Once | ORAL | Status: AC
  Administered 2017-10-28: 500 mg via ORAL
  Filled 2017-10-28: qty 1

## 2017-10-28 MED ORDER — LIDOCAINE-EPINEPHRINE 2 %-1:100000 IJ SOLN
1.7000 mL | Freq: Once | INTRAMUSCULAR | Status: AC
  Administered 2017-10-28: 1.7 mL
  Filled 2017-10-28: qty 1.7

## 2017-10-28 MED ORDER — AMOXICILLIN 500 MG PO CAPS: 500.0000 mg | ORAL_CAPSULE | Freq: Three times a day (TID) | ORAL | 0 refills | Status: DC

## 2017-10-28 NOTE — Discharge Instructions (Addendum)
Take the prescription antibiotic as directed. Take the pain medicine as needed. Apply warm compresses to the jaw to reduce swelling. Follow-up with the [facility] or find a Presenter, [facility].

## 2017-10-28 NOTE — ED Triage Notes (Signed)
Pt comes into the ED via POV c/o right lower dental pain that started Friday evening.  Patient has an abscessed tooth and cant see the oral surgeon until next week.  Patient states the pain and swelling has increased.  Patient in NAD at this time with even and unlabored respirations.

## 2017-10-29 NOTE — ED Provider Notes (Signed)
[facility] Emergency Department Provider Note ____________________________________________  Time seen: 1932  I have reviewed the triage vital signs and the nursing notes.  HISTORY  Chief Complaint  Dental Pain  HPI Thomas Monroe is a 72 y.o. male presents himself to the ED for evaluation of sudden swelling to the right lower jaw.  Patient admits to poor dentition, and a chronically broken right lower canine tooth.  He notes that he began to experience some lower dental pain on Friday evening.  By today he notes pain and swelling to the gum and lower jaw.  He denies any fevers, chills, or sweats.  The patient has not made arrangements to see a dental provider or oral surgeon.  He is here requesting antibiotics and some pain control for his focal gum infection.  Denies any chest pain, shortness of breath, difficulty swallowing.  Past Medical History:  Diagnosis Date  . BPH (benign prostatic hyperplasia)   . Cancer ([place])   . 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 DM (diabetes mellitus) ([place])    resolved  . GERD (gastroesophageal reflux disease)   . Gouty arthritis   . Heart attack ([place])   . Hiatal hernia   . History of kidney stones   . HTN (hypertension)   . Hypercholesterolemia     Patient Active Problem List   Diagnosis Date Noted  . Celiac disease 01/17/2012  . GERD (gastroesophageal reflux disease) 01/17/2012  . Dysphagia 01/17/2012    Past Surgical History:  Procedure Laterality Date  . COLONOSCOPY  07/07/04   normal/left-side diverticula  . ESOPHAGOGASTRODUODENOSCOPY  07/07/04   normal/small HH/otherwise normal  . KIDNEY STONE SURGERY      Prior to Admission medications   Medication Sig Start Date End Date Taking? Authorizing Provider  amoxicillin (AMOXIL) 500 MG capsule Take 1 capsule (500 mg total) by mouth 3 (three) times daily. 10/28/17   Gerrianne Aydelott, Dannielle Karvonen, PA-C  aspirin 81 MG tablet Take 81  mg by mouth daily.    [provider]  chlorpheniramine-HYDROcodone (TUSSIONEX PENNKINETIC ER) 10-8 MG/5ML SUER Take 5 mLs by mouth 2 (two) times daily. 07/13/16   Paulette Blanch, MD  clonazePAM (KLONOPIN) 0.5 MG tablet Take 0.5 mg by mouth at bedtime as needed.  12/08/11   [provider]  clopidogrel (PLAVIX) 75 MG tablet Take 75 mg by mouth daily.  12/27/11   [provider]  dicyclomine (BENTYL) 10 MG capsule Take 1 capsule (10 mg total) by mouth 4 (four) times daily -  before meals and at bedtime. 01/15/12 01/14/13  Annitta Needs, NP  esomeprazole (NEXIUM) 40 MG capsule Take 40 mg by mouth daily before breakfast.    [provider]  folic acid (FOLVITE) 1 MG tablet Take 1 mg by mouth daily.  01/05/12   [provider]  meloxicam (MOBIC) 7.5 MG tablet Take 7.5 mg by mouth daily.  01/08/12   [provider]  metoprolol succinate (TOPROL-XL) 50 MG 24 hr tablet Take 25 mg by mouth daily.  01/05/12   [provider]  ranitidine (ZANTAC) 150 MG capsule Take 150 mg by mouth 2 (two) times daily.    [provider]  Tamsulosin HCl (FLOMAX) 0.4 MG CAPS Take 0.4 mg by mouth daily.  01/05/12   [provider]  traMADol (ULTRAM) 50 MG tablet Take 1 tablet (50 mg total) by mouth 2 (two) times daily. 10/28/17   Obrien Huskins, [employer]  Nonda Lou, PA-C  Vitamin D, Ergocalciferol, (DRISDOL) 50000 UNITS CAPS Take 50,000 Units by mouth every 7 (seven) days.  01/05/12   [provider]    Allergies Klonopin [clonazepam]  Family History  Problem Relation Age of Onset  . Colon cancer Neg Hx     Social History Social History   Tobacco Use  . Smoking status: Former Research scientist (life sciences)  . Smokeless tobacco: Never Used  Substance Use Topics  . Alcohol use: No  . Drug use: No    Review of Systems  Constitutional: Negative for fever. Eyes: Negative for visual changes. ENT: Negative for sore throat.  Dental pain and swelling as above. Cardiovascular:  Negative for chest pain. Respiratory: Negative for shortness of breath. Gastrointestinal: Negative for abdominal pain, vomiting and diarrhea. Musculoskeletal: Negative for back pain. Skin: Negative for rash. Neurological: Negative for headaches, focal weakness or numbness. ____________________________________________  PHYSICAL EXAM:  VITAL SIGNS: ED Triage Vitals [10/28/17 1907]  Enc Vitals Group     BP 139/78     Pulse Rate 90     Resp 18     Temp 98.8 F (37.1 C)     Temp Source Oral     SpO2 96 %     Weight 180 lb (81.6 kg)     Height 6\' 1"  (1.854 m)     Head Circumference      Peak Flow      Pain Score 10     Pain Loc      Pain Edu?      Excl. in [place]?     Constitutional: Alert and oriented. Well appearing and in no distress. Head: Normocephalic and atraumatic. Eyes: Conjunctivae are normal. PERRL. Normal extraocular movements Ears: Canals clear. TMs intact bilaterally. Nose: No congestion/rhinorrhea/epistaxis. Mouth/Throat: Mucous membranes are moist.  Patient with poor dentition noted sporadically throughout the lower and upper jaws.  The area of concern to the right lower jaw is consistent with a chronically broken right lower canine tooth.  There is edema to the right side of the jaw and chin.  There is no sublingual edema noted.  The uvula is midline and the oropharynx is patent. Neck: Supple. No thyromegaly. Hematological/Lymphatic/Immunological: No cervical lymphadenopathy. Cardiovascular: Normal rate, regular rhythm. Normal distal pulses. Respiratory: Normal respiratory effort. No wheezes/rales/rhonchi. Musculoskeletal: Nontender with normal range of motion in all extremities.  Neurologic:  Normal gait without ataxia. Normal speech and language. No gross focal neurologic deficits are appreciated. Skin:  Skin is warm, dry and intact. No rash noted. ____________________________________________  PROCEDURES Amoxicillin 500 mg PO  DENTAL BLOCK  Performed by:  Melvenia Needles Consent: Verbal consent obtained. Required items: devices and special equipment available Time out: Immediately prior to procedure a "time out" was called to verify the correct patient, procedure, equipment, support staff and site/side marked as required.  Indication: pain Nerve block body site: lower right canine  Preparation: Patient was prepped and draped in the usual sterile fashion. Needle gauge: 46 G Location technique: anatomical landmarks  Local anesthetic: lido-epi 2%-1:100000  Anesthetic total: 1 ml  Outcome: pain improved Patient tolerance: Patient tolerated the procedure well with no immediate complications. ____________________________________________  INITIAL IMPRESSION / ASSESSMENT AND PLAN / ED COURSE  Patient with ED evaluation of acute dental infection and underlying dental caries.  Patient is stable on presentation but is noted to have an acute focal infection to the lower right jaw.  Patient is treated with amoxicillin in the ED and a local dental block  is provided for pain relief.  Patient reports resolution of his pain at time of discharge.  He will be discharged with prescription for amoxicillin as well as Ultram No. 10 for pain relief.  He is also advised to follow-up with a local dental provider or dental surgeon for definitive management.  Return precautions have been reviewed. ____________________________________________  FINAL CLINICAL IMPRESSION(S) / ED DIAGNOSES  Final diagnoses:  Dental infection  Dental caries      Melvenia Needles, PA-C 10/29/17 0028    Harvest Dark, MD 10/29/17 2333

## 2018-10-17 ENCOUNTER — Ambulatory Visit (INDEPENDENT_AMBULATORY_CARE_PROVIDER_SITE_OTHER): Payer: Medicare HMO | Admitting: Surgery

## 2018-10-17 ENCOUNTER — Encounter: Payer: Self-pay | Admitting: Surgery

## 2018-10-17 ENCOUNTER — Other Ambulatory Visit: Payer: Self-pay

## 2018-10-17 VITALS — BP 131/87 | HR 83 | Temp 97.9°F | Resp 15 | Wt 190.2 lb

## 2018-10-17 DIAGNOSIS — R159 Full incontinence of feces: Secondary | ICD-10-CM

## 2018-10-17 DIAGNOSIS — K623 Rectal prolapse: Secondary | ICD-10-CM | POA: Insufficient documentation

## 2018-10-17 NOTE — Progress Notes (Signed)
Surgical Clinic History and Physical  Referring provider:  [name], MD [address] [place], [place] [address]  HISTORY OF PRESENT ILLNESS (HPI):  73 y.o. male 6.5 years s/p colonoscopy and hemorrhoidectomy Jamal Collin, 02/06/2012) presents for anorectal evaluation due to concern "something has come loose". He describes that Dr. Jamal Collin had told him his muscles were loose and "something about staples", which he expressed concern one may have become loose. He's recently noticed mild-/moderate- perianal pain with what he demonstrates to be 1 - 4 cm of circumferential rectum "pushes out" with BM's, heavy lifting at work (35 - 40 lbs), or prolonged standing. He adds that he recently stopped working due to the release of a large amount of mucus into his underwear while working. He otherwise states his BM's are soft and regular x1-2 per day, but are "long thin streaks", which he attributes to the circumferential ring of rectum that "pushes out" during BM's. Patient denies any hard BM's or straining for BM's and denies blood per rectum, N/V, abdominal distention, fever/chills, CP, or SOB.  PAST MEDICAL HISTORY (PMH):  Past Medical History:  Diagnosis Date  . BPH (benign prostatic hyperplasia)   . Cancer ([place])   . 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 DM (diabetes mellitus) ([place])    resolved  . GERD (gastroesophageal reflux disease)   . Gouty arthritis   . Heart attack ([place])   . Hiatal hernia   . History of kidney stones   . HTN (hypertension)   . Hypercholesterolemia     PAST SURGICAL HISTORY ([place]):  Past Surgical History:  Procedure Laterality Date  . COLONOSCOPY  07/07/04   normal/left-side diverticula  . ESOPHAGOGASTRODUODENOSCOPY  07/07/04   normal/small HH/otherwise normal  . KIDNEY STONE SURGERY    . STAPLE HEMORRHOIDECTOMY  2013   Dr Jamal Collin    MEDICATIONS:  Prior to Admission medications   Medication Sig Start Date End Date Taking?  Authorizing Provider  aspirin 81 MG tablet Take 81 mg by mouth daily.   Yes [provider]  atorvastatin (LIPITOR) 40 MG tablet Take 40 mg by mouth daily.   Yes [provider]  clonazePAM (KLONOPIN) 1 MG tablet Take 1 mg by mouth as needed for anxiety.   Yes [provider]  clopidogrel (PLAVIX) 75 MG tablet Take 75 mg by mouth daily.  12/27/11  Yes [provider]  folic acid (FOLVITE) 1 MG tablet Take 1 mg by mouth daily.  01/05/12  Yes [provider]  hydrocortisone (PROCTOZONE-HC) 2.5 % rectal cream Place 1 application rectally 2 (two) times daily.   Yes [provider]  omeprazole (PRILOSEC) 10 MG capsule Take 10 mg by mouth daily.   Yes [provider]  simethicone (MYLICON) 371 MG chewable tablet Chew 125 mg by mouth every 6 (six) hours as needed for flatulence.   Yes [provider]  Tamsulosin HCl (FLOMAX) 0.4 MG CAPS Take 0.4 mg by mouth daily.  01/05/12  Yes [provider]    ALLERGIES:  Allergies  Allergen Reactions  . Klonopin [Clonazepam]     SOCIAL HISTORY:  Social History   Socioeconomic History  . Marital status: Married    Spouse name: Not on file  . Number of children: Not on file  . Years of education: Not on file  . Highest education level: Not on file  Occupational History  . Occupation: Probation officer    Comment: [employer]  .  Financial resource strain: Not on file  . Food insecurity:    Worry: Not on file    Inability: Not on file  . Transportation needs:    Medical: Not on file    Non-medical: Not on file  Tobacco Use  . Smoking status: Former Research scientist (life sciences)  . Smokeless tobacco: Never Used  Substance and Sexual Activity  . Alcohol use: No  . Drug use: No  . Sexual activity: Not on file  Lifestyle  . Physical activity:    Days per week: Not on file    Minutes per session: Not on file  . Stress: Not on file  Relationships  . Social connections:    Talks on  phone: Not on file    Gets together: Not on file    Attends religious service: Not on file    Active member of club or organization: Not on file    Attends meetings of clubs or organizations: Not on file    Relationship status: Not on file  . Intimate partner violence:    Fear of current or ex partner: Not on file    Emotionally abused: Not on file    Physically abused: Not on file    Forced sexual activity: Not on file  Other Topics Concern  . Not on file  Social History Narrative  . Not on file    The patient currently resides (home / rehab facility / nursing home): Home The patient normally is (ambulatory / bedbound): Ambulatory  FAMILY HISTORY:  Family History  Problem Relation Age of Onset  . Colon cancer Neg Hx     Otherwise negative/non-contributory.  REVIEW OF SYSTEMS:  Constitutional: denies any other weight loss, fever, chills, or sweats  Eyes: denies any other vision changes, history of eye injury  ENT: denies sore throat, hearing problems  Respiratory: denies shortness of breath, wheezing  Cardiovascular: denies chest pain, palpitations  Gastrointestinal: abdominal pain, N/V, and bowel function as per HPI Musculoskeletal: denies any other joint pains or cramps  Skin: Denies any other rashes or skin discolorations except as per HPI Neurological: denies any other headache, dizziness, weakness  Psychiatric: Denies any other depression, anxiety   All other review of systems were otherwise negative   VITAL SIGNS:  BP 131/87   Pulse 83   Temp 97.9 F (36.6 C)   Resp 15   Wt 190 lb 3.2 oz (86.3 kg)   SpO2 96%   BMI 25.09 kg/m   PHYSICAL EXAM:  Constitutional:  -- Normal body habitus  -- Awake, alert, and oriented x3  Eyes:  -- Pupils equally round and reactive to light  -- No scleral icterus  Ear, nose, throat:  -- No jugular venous distension -- No nasal drainage, bleeding Pulmonary:  -- No crackles  -- Equal breath sounds bilaterally -- Breathing  non-labored at rest Cardiovascular:  -- S1, S2 present  -- No pericardial rubs  Gastrointestinal:  -- Abdomen soft, non-tender to palpation, non-distended, no guarding/rebound tenderness -- No abdominal masses appreciated, pulsatile or otherwise  Anorectal: -- No visible rectal prolapse, hemorrhoids, fissure, fistula, or blood per rectum appreciated -- Mildly loose anal sphincter tone with a single 4 mm non-tender to palpation perineal skin tag Musculoskeletal and Integumentary:  -- Wounds or skin discoloration: None appreciated except as described above (GI, anorectal) -- Extremities: B/L UE and LE FROM, hands and feet warm, no edema  Neurologic:  -- Motor function: Intact and symmetric -- Sensation: Intact and symmetric  Labs:  CBC Latest Ref Rng & Units 07/12/2016 09/04/2012 01/31/2012  WBC 3.8 - 10.6 K/uL 8.4 9.8 7.3  Hemoglobin 13.0 - 18.0 g/dL 14.2 12.5(L) 13.0  Hematocrit 40.0 - 52.0 % 42.6 38.5(L) 39.7(L)  Platelets 150 - 440 K/uL 218 233 196   CMP Latest Ref Rng & Units 07/12/2016 09/04/2012 01/31/2012  Glucose 65 - 99 mg/dL 122(H) 94 90  BUN 6 - 20 mg/dL 16 17 16   Creatinine 0.61 - 1.24 mg/dL 1.56(H) 1.05 0.98  Sodium 135 - 145 mmol/L 134(L) 140 139  Potassium 3.5 - 5.1 mmol/L 4.5 4.5 5.1  Chloride 101 - 111 mmol/L 98(L) 108(H) 105  CO2 22 - 32 mmol/L 26 28 26   Calcium 8.9 - 10.3 mg/dL 8.8(L) 8.0(L) 8.8   Imaging studies: No new pertinent imaging studies available for review at this time   Assessment/Plan:  73 y.o. male with fecal incontinence and what sounds per patient's description and Dr. Angie Fava operative report consistent with rectal prolapse, though none was appreciably able to be provoked or visualized otherwise today.   - offered referral to colorectal surgery for further evaluation  - continue to maintain hydration with heart-healthy high fiber diet +/- stool softener prn if constipation  - return to clinic as needed, instructed to call if any questions or  concerns  All of the above recommendations were discussed with the patient, and all of patient's questions were answered to his expressed satisfaction.  Thank you for the opportunity to participate in this patient's care.  -- Marilynne Drivers Rosana Hoes, MD, [place]: [facility] - Partnering for exceptional care. Office: [phone]

## 2018-10-17 NOTE — Patient Instructions (Addendum)
We will refer you to Colorectal surgery for assessment.  [facility] PH: [phone]   Rectal Prolapse, Adult Rectal prolapse happens when the inside of the last section of the large intestine (rectum) drops down into an abnormal position. There are two types of rectal prolapse:  Partial. In partial rectal prolapse, the inner lining of the rectum falls or sinks out of place and may stick out of the anus.  Complete. In complete rectal prolapse, all layers of the rectum fall or sink out of place and may stick out of the anus. At first, rectal prolapse may be temporary. It may happen only when you are having a bowel movement. Over time, the prolapse will likely get worse. It may start to happen more often and cause uncomfortable symptoms. Eventually, the prolapse may happen when you are walking or standing. Surgery is often needed for this condition. What are the causes? This condition may be caused by weakness in the muscles that attach the rectum to the inside of the lower abdomen. The exact cause of this muscle weakness is often not known. What increases the risk? You are more likely to develop this condition if you:  Have a history of chronic constipation or diarrhea.  Frequently strain to have a bowel movement.  Are a woman who is 72 years of age or older.  Have a neurological disorder, such as multiple sclerosis, or lower spinal cord injury.  Are a woman who has been pregnant many times.  Have had pelvic or rectal surgery.  Have cystic fibrosis. What are the signs or symptoms? The main symptom of this condition is a red mass of tissue sticking out of your anus. The mass may appear inflamed, have mucus, or bleed slightly.  At first, the mass may only appear after a bowel movement.  The mass may then start to appear more often. Other symptoms may include:  Discomfort in the anus and rectum.  Constipation.  Feeling that stool is not completely emptied from the  rectum.  Diarrhea.  Leaking of stool, mucus, or blood from the anus (fecal incontinence).  Feeling like you are sitting on a ball. How is this diagnosed? This condition may be diagnosed based on your symptoms and a physical exam.  During the exam, you may be asked to squat and strain as though you are having a bowel movement.  You may also have tests, such as: ? A rectal exam using a flexible scope (sigmoidoscopy or colonoscopy). ? A procedure that involves taking X-rays of your rectum after a dye (contrast material) is injected into the rectum (defecogram). How is this treated? This condition is usually treated with surgery to repair the weakened muscles and to reconnect the rectum to attachments inside the lower abdomen. Other treatment options may include:  Pushing the prolapsed area back into the rectum (reduction). ? Your health care provider may do this by gently pushing it back in using a moist cloth. ? The health care provider may show you how to do this at home if the prolapse occurs again.  Medicines to prevent constipation and straining. This may include laxatives or stool softeners. Follow these instructions at home: General instructions   Take over-the-counter and prescription medicines only as told by your health care provider.  Do not strain to have a bowel movement.  Do not lift anything that is heavier than 10 lb (4.5 kg), or the limit that you are told, until your health care provider says that it is safe.  Follow instructions from your health care provider about what to do if the prolapse occurs again and does not go back in. This may involve lying on your side and using a moist cloth to gently press the lump into your rectum.  Keep all follow-up visits as told by your health care provider. This is important. Preventing constipation To prevent or treat constipation, your health care provider may recommend that you:  Drink enough fluid to keep your urine pale  yellow.  Take over-the-counter or prescription medicines.  Eat foods that are high in fiber, such as beans, whole grains, and fresh fruits and vegetables.  Limit foods that are high in fat and processed sugars, such as fried or sweet foods.  Contact a health care provider if:  You have a fever.  Your prolapse cannot be reduced at home.  You have constipation or diarrhea.  You have mild rectal bleeding. Get help right away if:  You have very bad rectal pain.  You bleed heavily from your rectum. Summary  Rectal prolapse happens when the inside of the rectum drops down into an abnormal position. In some cases, the rectum may partially or completely push out through the anal opening.  This condition is usually treated with surgery to repair the weakened muscles and to reconnect the rectum to attachments inside the lower abdomen.  Take over-the-counter and prescription medicines only as told by your health care provider.  Follow instructions from your health care provider about what to do if the prolapse occurs again and does not go back in.  Get help right away if you have very bad rectal pain or if you bleed heavily from your rectum. This information is not intended to replace advice given to you by your health care provider. Make sure you discuss any questions you have with your health care provider. Document Released: 02/10/2015 Document Revised: 11/28/2017 Document Reviewed: 11/28/2017 Elsevier Interactive Patient Education  2019 [employer].

## 2018-10-24 ENCOUNTER — Ambulatory Visit: Payer: Self-pay | Admitting: Surgery

## 2018-11-06 ENCOUNTER — Telehealth: Payer: Self-pay | Admitting: *Deleted

## 2018-11-06 NOTE — Telephone Encounter (Signed)
Received FMLA papers. No surgery scheduled, unsure what this documentation is for?

## 2018-11-07 NOTE — Telephone Encounter (Signed)
No surgery scheduled, no return call, discarded papers.

## 2019-07-27 ENCOUNTER — Ambulatory Visit: Payer: Medicare HMO | Attending: Internal Medicine

## 2019-07-27 DIAGNOSIS — Z23 Encounter for immunization: Secondary | ICD-10-CM

## 2019-07-27 NOTE — Progress Notes (Signed)
   Covid-19 Vaccination Clinic  Name:  Thomas Monroe    MRN: [id number] DOB: Dec 01, 1945  07/27/2019  Mr. Mangal was observed post Covid-19 immunization for 15 minutes without incidence. He was provided with Vaccine Information Sheet and instruction to access the V-Safe system.   Mr. Gruett was instructed to call 911 with any severe reactions post vaccine: Marland Kitchen Difficulty breathing  . Swelling of your face and throat  . A fast heartbeat  . A bad rash all over your body  . Dizziness and weakness    Immunizations Administered    Name Date Dose VIS Date Route   Pfizer COVID-19 Vaccine 07/27/2019  1:26 PM 0.3 mL 05/16/2019 Intramuscular   Manufacturer: [place]   Lot: [id number]   [place]: [id number]

## 2019-08-20 ENCOUNTER — Ambulatory Visit: Payer: Medicare HMO | Attending: Internal Medicine

## 2019-08-20 DIAGNOSIS — Z23 Encounter for immunization: Secondary | ICD-10-CM

## 2019-08-20 NOTE — Progress Notes (Signed)
   Covid-19 Vaccination Clinic  Name:  Thomas Monroe    MRN: [id number] DOB: [birth year]  08/20/2019  [name] was observed post Covid-19 immunization for 15 minutes without incident. He was provided with Vaccine Information Sheet and instruction to access the V-Safe system.   [name] was instructed to call 911 with any severe reactions post vaccine: Marland Kitchen Difficulty breathing  . Swelling of face and throat  . A fast heartbeat  . A bad rash all over body  . Dizziness and weakness   Immunizations Administered    Name Date Dose VIS Date Route   Pfizer COVID-19 Vaccine 08/20/2019  8:24 AM 0.3 mL 05/16/2019 Intramuscular   Manufacturer: [place]   Lot: [id number]   [place]: [id number]

## 2020-03-05 ENCOUNTER — Other Ambulatory Visit: Payer: Self-pay

## 2020-03-05 ENCOUNTER — Other Ambulatory Visit
Admission: RE | Admit: 2020-03-05 | Discharge: 2020-03-05 | Disposition: A | Discharge status: Home / Self Care | Payer: Medicare HMO | Source: Ambulatory Visit | Attending: Gastroenterology | Admitting: Gastroenterology

## 2020-03-05 DIAGNOSIS — Z01812 Encounter for preprocedural laboratory examination: Secondary | ICD-10-CM | POA: Insufficient documentation

## 2020-03-05 DIAGNOSIS — Z20822 Contact with and (suspected) exposure to covid-19: Secondary | ICD-10-CM | POA: Diagnosis not present

## 2020-03-06 LAB — SARS CORONAVIRUS 2 (TAT 6-24 HRS): SARS Coronavirus 2: NEGATIVE

## 2020-03-09 ENCOUNTER — Ambulatory Visit: Payer: Medicare HMO | Admitting: Anesthesiology

## 2020-03-09 ENCOUNTER — Other Ambulatory Visit: Payer: Self-pay

## 2020-03-09 ENCOUNTER — Ambulatory Visit
Admission: RE | Admit: 2020-03-09 | Discharge: 2020-03-09 | Disposition: A | Discharge status: Home / Self Care | Payer: Medicare HMO | Source: Ambulatory Visit | Attending: Gastroenterology | Admitting: Gastroenterology

## 2020-03-09 ENCOUNTER — Encounter
Admission: RE | Disposition: A | Discharge status: Home / Self Care | Payer: Self-pay | Source: Ambulatory Visit | Attending: Gastroenterology

## 2020-03-09 DIAGNOSIS — Z888 Allergy status to other drugs, medicaments and biological substances status: Secondary | ICD-10-CM | POA: Diagnosis not present

## 2020-03-09 DIAGNOSIS — Z859 Personal history of malignant neoplasm, unspecified: Secondary | ICD-10-CM | POA: Diagnosis not present

## 2020-03-09 DIAGNOSIS — K219 Gastro-esophageal reflux disease without esophagitis: Secondary | ICD-10-CM | POA: Diagnosis not present

## 2020-03-09 DIAGNOSIS — K6389 Other specified diseases of intestine: Secondary | ICD-10-CM | POA: Insufficient documentation

## 2020-03-09 DIAGNOSIS — I252 Old myocardial infarction: Secondary | ICD-10-CM | POA: Insufficient documentation

## 2020-03-09 DIAGNOSIS — E7800 Pure hypercholesterolemia, unspecified: Secondary | ICD-10-CM | POA: Diagnosis not present

## 2020-03-09 DIAGNOSIS — E119 Type 2 diabetes mellitus without complications: Secondary | ICD-10-CM | POA: Insufficient documentation

## 2020-03-09 DIAGNOSIS — R1310 Dysphagia, unspecified: Secondary | ICD-10-CM | POA: Diagnosis not present

## 2020-03-09 DIAGNOSIS — M109 Gout, unspecified: Secondary | ICD-10-CM | POA: Insufficient documentation

## 2020-03-09 DIAGNOSIS — K529 Noninfective gastroenteritis and colitis, unspecified: Secondary | ICD-10-CM | POA: Diagnosis not present

## 2020-03-09 DIAGNOSIS — Z7902 Long term (current) use of antithrombotics/antiplatelets: Secondary | ICD-10-CM | POA: Diagnosis not present

## 2020-03-09 DIAGNOSIS — Z87442 Personal history of urinary calculi: Secondary | ICD-10-CM | POA: Insufficient documentation

## 2020-03-09 DIAGNOSIS — Z7982 Long term (current) use of aspirin: Secondary | ICD-10-CM | POA: Diagnosis not present

## 2020-03-09 DIAGNOSIS — Z79899 Other long term (current) drug therapy: Secondary | ICD-10-CM | POA: Diagnosis not present

## 2020-03-09 DIAGNOSIS — N400 Enlarged prostate without lower urinary tract symptoms: Secondary | ICD-10-CM | POA: Insufficient documentation

## 2020-03-09 DIAGNOSIS — I10 Essential (primary) hypertension: Secondary | ICD-10-CM | POA: Diagnosis not present

## 2020-03-09 DIAGNOSIS — D72820 Lymphocytosis (symptomatic): Secondary | ICD-10-CM | POA: Diagnosis not present

## 2020-03-09 DIAGNOSIS — Z8719 Personal history of other diseases of the digestive system: Secondary | ICD-10-CM | POA: Insufficient documentation

## 2020-03-09 DIAGNOSIS — K3189 Other diseases of stomach and duodenum: Secondary | ICD-10-CM | POA: Insufficient documentation

## 2020-03-09 DIAGNOSIS — K640 First degree hemorrhoids: Secondary | ICD-10-CM | POA: Diagnosis not present

## 2020-03-09 HISTORY — PX: ESOPHAGOGASTRODUODENOSCOPY (EGD) WITH PROPOFOL: SHX5813

## 2020-03-09 HISTORY — PX: COLONOSCOPY WITH PROPOFOL: SHX5780

## 2020-03-09 LAB — GLUCOSE, CAPILLARY: Glucose-Capillary: 80 mg/dL (ref 70–99)

## 2020-03-09 SURGERY — COLONOSCOPY WITH PROPOFOL
Anesthesia: General

## 2020-03-09 MED ORDER — LIDOCAINE HCL (CARDIAC) PF 100 MG/5ML IV SOSY
PREFILLED_SYRINGE | INTRAVENOUS | Status: DC | PRN
  Administered 2020-03-09: 100 mg via INTRAVENOUS

## 2020-03-09 MED ORDER — PHENYLEPHRINE HCL (PRESSORS) 10 MG/ML IV SOLN
INTRAVENOUS | Status: DC | PRN
  Administered 2020-03-09 (×2): 100 ug via INTRAVENOUS

## 2020-03-09 MED ORDER — SODIUM CHLORIDE 0.9 % IV SOLN: INTRAVENOUS | Status: DC

## 2020-03-09 MED ORDER — PROPOFOL 10 MG/ML IV BOLUS
INTRAVENOUS | Status: DC | PRN
  Administered 2020-03-09: 60 mg via INTRAVENOUS

## 2020-03-09 MED ORDER — GLYCOPYRROLATE 0.2 MG/ML IJ SOLN
INTRAMUSCULAR | Status: DC | PRN
  Administered 2020-03-09: .2 mg via INTRAVENOUS

## 2020-03-09 MED ORDER — EPHEDRINE SULFATE 50 MG/ML IJ SOLN
INTRAMUSCULAR | Status: DC | PRN
  Administered 2020-03-09: 10 mg via INTRAVENOUS
  Administered 2020-03-09: 5 mg via INTRAVENOUS

## 2020-03-09 MED ORDER — PROPOFOL 500 MG/50ML IV EMUL
INTRAVENOUS | Status: DC | PRN
  Administered 2020-03-09: 150 ug/kg/min via INTRAVENOUS

## 2020-03-09 NOTE — H&P (Signed)
Outpatient short stay form Pre-procedure 03/09/2020 10:02 AM Raylene Miyamoto MD, MPH  Primary Physician: Dr. Ronnald Collum  Reason for visit:  Diarrhea  History of present illness:   74 y/o gentleman with history of hemorrhoid removal and rectal repair years ago. Has starting having diarrhea with nocturnal symptoms. There is some question of if he has celiac. Takes plavix with last dose being 2 weeks ago. No family history of GI malignancies. No abdominal surgeries.    Current Facility-Administered Medications:  .  0.9 %  sodium chloride infusion, , Intravenous, Continuous, Subrina Vecchiarelli, Hilton Cork, MD, Last Rate: 20 mL/hr at 03/09/20 0921, New Bag at 03/09/20 0921  Medications Prior to Admission  Medication Sig Dispense Refill Last Dose  . azelastine (ASTELIN) 0.1 % nasal spray Place 1 spray into both nostrils 2 (two) times daily. Use in each nostril as directed     . ergocalciferol (VITAMIN D2) 1.25 MG (50000 UT) capsule Take 50,000 Units by mouth once a week.   Past Week at Unknown time  . folic acid (FOLVITE) 1 MG tablet Take 1 mg by mouth daily.    Past Week at Unknown time  . nitroGLYCERIN (NITROSTAT) 0.4 MG SL tablet Place 0.4 mg under the tongue every 5 (five) minutes as needed for chest pain.     Marland Kitchen triamcinolone cream (KENALOG) 0.1 % Apply 1 application topically 2 (two) times daily.     Marland Kitchen aspirin 81 MG tablet Take 81 mg by mouth daily.     Marland Kitchen atorvastatin (LIPITOR) 40 MG tablet Take 40 mg by mouth daily.     . clonazePAM (KLONOPIN) 1 MG tablet Take 1 mg by mouth as needed for anxiety.     . clopidogrel (PLAVIX) 75 MG tablet Take 75 mg by mouth daily.    03/02/2020  . hydrocortisone (PROCTOZONE-HC) 2.5 % rectal cream Place 1 application rectally 2 (two) times daily.     Marland Kitchen omeprazole (PRILOSEC) 10 MG capsule Take 10 mg by mouth daily.     . simethicone (MYLICON) 974 MG chewable tablet Chew 125 mg by mouth every 6 (six) hours as needed for flatulence.     . Tamsulosin HCl (FLOMAX) 0.4 MG CAPS  Take 0.4 mg by mouth daily.         Allergies  Allergen Reactions  . Klonopin [Clonazepam]     Patient says he is not allergic to this medication     Past Medical History:  Diagnosis Date  . BPH (benign prostatic hyperplasia)   . Cancer ([place])   . 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 DM (diabetes mellitus) ([place])    resolved  . GERD (gastroesophageal reflux disease)   . Gouty arthritis   . Heart attack ([place])   . Hiatal hernia   . History of kidney stones   . HTN (hypertension)   . Hypercholesterolemia     Review of systems:  Otherwise negative.    Physical Exam  Gen: Alert, oriented. Appears stated age.  HEENT: [place]/AT. PERRLA. Lungs: No respiratory distress Abd: soft, benign, no masses. Ext: No edema.     Planned procedures: Proceed with EGD/colonoscopy. The patient understands the nature of the planned procedure, indications, risks, alternatives and potential complications including but not limited to bleeding, infection, perforation, damage to internal organs and possible oversedation/side effects from anesthesia. The patient agrees and gives consent to proceed.  Please refer to procedure notes for findings, recommendations and patient disposition/instructions.  Raylene Miyamoto MD, MPH Gastroenterology 03/09/2020  10:02 AM

## 2020-03-09 NOTE — Op Note (Signed)
[facility] Gastroenterology Patient Name: Thomas Monroe Procedure Date: 03/09/2020 10:04 AM MRN: [id number] Account #: [id number] Date of Birth: 1946-03-14 Admit Type: Outpatient Age: 74 Room: [facility] ENDO ROOM 3 Gender: Male Note Status: Finalized Procedure:             Upper GI endoscopy Indications:           Periumbilical abdominal pain, Dysphagia,                         Gastro-esophageal reflux disease Providers:             Andrey Farmer MD, MD Referring MD:          Belinda Fisher, MD (Referring MD) Medicines:             Monitored Anesthesia Care Complications:         No immediate complications. Estimated blood loss:                         Minimal. Procedure:             Pre-Anesthesia Assessment:                        - Prior to the procedure, a History and Physical was                         performed, and patient medications and allergies were                         reviewed. The patient is competent. The risks and                         benefits of the procedure and the sedation options and                         risks were discussed with the patient. All questions                         were answered and informed consent was obtained.                         Patient identification and proposed procedure were                         verified by the physician, the nurse, the anesthetist                         and the technician in the endoscopy suite. Mental                         Status Examination: alert and oriented. Airway                         Examination: normal oropharyngeal airway and neck                         mobility. Respiratory Examination: clear to                         auscultation.  CV Examination: normal. Prophylactic                         Antibiotics: The patient does not require prophylactic                         antibiotics. Prior Anticoagulants: The patient has                         taken no previous  anticoagulant or antiplatelet                         agents. ASA Grade Assessment: III - A patient with                         severe systemic disease. After reviewing the risks and                         benefits, the patient was deemed in satisfactory                         condition to undergo the procedure. The anesthesia                         plan was to use monitored anesthesia care (MAC).                         Immediately prior to administration of medications,                         the patient was re-assessed for adequacy to receive                         sedatives. The heart rate, respiratory rate, oxygen                         saturations, blood pressure, adequacy of pulmonary                         ventilation, and response to care were monitored                         throughout the procedure. The physical status of the                         patient was re-assessed after the procedure.                        After obtaining informed consent, the endoscope was                         passed under direct vision. Throughout the procedure,                         the patient's blood pressure, pulse, and oxygen                         saturations were monitored continuously. The Endoscope  was introduced through the mouth, and advanced to the                         second part of duodenum. The upper GI endoscopy was                         accomplished without difficulty. The patient tolerated                         the procedure well. Findings:      The examined esophagus was normal.      The entire examined stomach was normal.      Decreased folds were found in the duodenal bulb, flattening was found in       the duodenal bulb, flattening was found in the first portion of the       duodenum, flattening was found in the second portion of the duodenum,       scalloped mucosa was found in the duodenal bulb, scalloped mucosa was       found in  the first portion of the duodenum and scalloped mucosa was       found in the second portion of the duodenum. Biopsies for histology were       taken with a cold forceps for evaluation of celiac disease. Estimated       blood loss was minimal.      A single 10 mm submucosal nodule with a localized distribution was found       in the second portion of the duodenum. Likely lipoma. Impression:            - Normal esophagus.                        - Normal stomach.                        - Duodenal mucosal changes seen, suspicious for celiac                         disease. Biopsied.                        - Submucosal nodule found in the duodenum. Recommendation:        - Discharge patient to home.                        - Resume previous diet.                        - Continue present medications.                        - Await pathology results. After pathology results                         reviewed, may need an upper EUS for evaluation of                         submucosal nodule.                        - Return to referring physician as previously  scheduled. Procedure Code(s):     --- Professional ---                        [phone], Esophagogastroduodenoscopy, flexible,                         transoral; with biopsy, single or multiple Diagnosis Code(s):     --- Professional ---                        K31.89, Other diseases of stomach and duodenum                        M22.63, Periumbilical pain                        R13.10, Dysphagia, unspecified                        K21.9, Gastro-esophageal reflux disease without                         esophagitis CPT copyright 2019 American [employer] Association. All rights reserved. The codes documented in this report are preliminary and upon coder review may  be revised to meet current compliance requirements. Andrey Farmer, MD Andrey Farmer MD, MD 03/09/2020 10:43:38 AM Number of Addenda: 0 Note Initiated On:  03/09/2020 10:04 AM Estimated Blood Loss:  Estimated blood loss was minimal.      [facility]

## 2020-03-09 NOTE — Op Note (Signed)
[facility] Gastroenterology Patient Name: Thomas Monroe Procedure Date: 03/09/2020 10:04 AM MRN: [id number] Account #: [id number] Date of Birth: Jan 15, 1946 Admit Type: Outpatient Age: 74 Room: [facility] ENDO ROOM 3 Gender: Male Note Status: Finalized Procedure:             Colonoscopy Indications:           Periumbilical abdominal pain, Chronic diarrhea Providers:             Andrey Farmer MD, MD Referring MD:          [name], MD (Referring MD) Medicines:             Monitored Anesthesia Care Complications:         No immediate complications. Estimated blood loss:                         Minimal. Procedure:             Pre-Anesthesia Assessment:                        - Prior to the procedure, a History and Physical was                         performed, and patient medications and allergies were                         reviewed. The patient is competent. The risks and                         benefits of the procedure and the sedation options and                         risks were discussed with the patient. All questions                         were answered and informed consent was obtained.                         Patient identification and proposed procedure were                         verified by the physician, the nurse, the anesthetist                         and the technician in the endoscopy suite. Mental                         Status Examination: alert and oriented. Airway                         Examination: normal oropharyngeal airway and neck                         mobility. Respiratory Examination: clear to                         auscultation. CV Examination: normal. Prophylactic  Antibiotics: The patient does not require prophylactic                         antibiotics. Prior Anticoagulants: The patient has                         taken Plavix (clopidogrel), last dose was 14 days                         prior to  procedure. ASA Grade Assessment: III - A                         patient with severe systemic disease. After reviewing                         the risks and benefits, the patient was deemed in                         satisfactory condition to undergo the procedure. The                         anesthesia plan was to use monitored anesthesia care                         (MAC). Immediately prior to administration of                         medications, the patient was re-assessed for adequacy                         to receive sedatives. The heart rate, respiratory                         rate, oxygen saturations, blood pressure, adequacy of                         pulmonary ventilation, and response to care were                         monitored throughout the procedure. The physical                         status of the patient was re-assessed after the                         procedure.                        After obtaining informed consent, the colonoscope was                         passed under direct vision. Throughout the procedure,                         the patient's blood pressure, pulse, and oxygen                         saturations were monitored continuously. The  Colonoscope was introduced through the anus and                         advanced to the the terminal ileum. The colonoscopy                         was performed without difficulty. The patient                         tolerated the procedure well. The quality of the bowel                         preparation was poor. Findings:      The perianal and digital rectal examinations were normal.      The terminal ileum appeared normal.      A large amount of semi-solid stool was found at the hepatic flexure and       in the ascending colon, precluding visualization.      Exudate was found in the entire colon. Biopsies for histology were taken       with a cold forceps from the entire colon for  evaluation of microscopic       colitis. Estimated blood loss was minimal.      Non-bleeding internal hemorrhoids were found during retroflexion. The       hemorrhoids were Grade I (internal hemorrhoids that do not prolapse). Impression:            - Preparation of the colon was poor.                        - The examined portion of the ileum was normal.                        - Stool at the hepatic flexure and in the ascending                         colon.                        - Exudate in the entire examined colon. Biopsied.                        - Non-bleeding internal hemorrhoids. Recommendation:        - Repeat colonoscopy date to be determined after                         pending pathology results are reviewed because the                         bowel preparation was suboptimal.                        - Await pathology results.                        - Resume previous diet.                        - Continue present medications.                        -  Resume Plavix (clopidogrel) at prior dose today.                        - Discharge patient to home. Procedure Code(s):     --- Professional ---                        [phone], Colonoscopy, flexible; with biopsy, single or                         multiple Diagnosis Code(s):     --- Professional ---                        K64.0, First degree hemorrhoids                        K63.89, Other specified diseases of intestine                        Z30.07, Periumbilical pain                        K52.9, Noninfective gastroenteritis and colitis,                         unspecified CPT copyright 2019 American Medical Association. All rights reserved. The codes documented in this report are preliminary and upon coder review may  be revised to meet current compliance requirements. Andrey Farmer, MD Andrey Farmer MD, MD 03/09/2020 10:49:11 AM Number of Addenda: 0 Note Initiated On: 03/09/2020 10:04 AM Scope Withdrawal Time: 0  hours 9 minutes 20 seconds  Total Procedure Duration: 0 hours 14 minutes 53 seconds  Estimated Blood Loss:  Estimated blood loss was minimal.      [facility]

## 2020-03-09 NOTE — Interval H&P Note (Signed)
History and Physical Interval Note:  03/09/2020 10:06 AM  Thomas Monroe  has presented today for surgery, with the diagnosis of PERI-UMBILICAL PAIN  BOWEL HABIT CHANGES DIARRHEA GERD.  The various methods of treatment have been discussed with the patient and family. After consideration of risks, benefits and other options for treatment, the patient has consented to  Procedure(s): COLONOSCOPY WITH PROPOFOL (N/A) ESOPHAGOGASTRODUODENOSCOPY (EGD) WITH PROPOFOL (N/A) as a surgical intervention.  The patient's history has been reviewed, patient examined, no change in status, stable for surgery.  I have reviewed the patient's chart and labs.  Questions were answered to the patient's satisfaction.     Lesly Rubenstein  Ok to proceed with EGD/Colonoscopy

## 2020-03-09 NOTE — Anesthesia Procedure Notes (Signed)
Procedure Name: General with mask airway Performed by: Fletcher-Harrison, Elara Cocke, CRNA Pre-anesthesia Checklist: Patient identified, Emergency Drugs available, Suction available and Patient being monitored Patient Re-evaluated:Patient Re-evaluated prior to induction Oxygen Delivery Method: Simple face mask Induction Type: IV induction Placement Confirmation: positive ETCO2 and CO2 detector Dental Injury: Teeth and Oropharynx as per pre-operative assessment        

## 2020-03-09 NOTE — Anesthesia Preprocedure Evaluation (Signed)
Anesthesia Evaluation  Patient identified by MRN, date of birth, ID band Patient awake    Reviewed: Allergy & Precautions, H&P , NPO status , Patient's Chart, lab work & pertinent test results  History of Anesthesia Complications Negative for: history of anesthetic complications  Airway Mallampati: III  TM Distance: >3 FB Neck ROM: limited    Dental  (+) Chipped, Poor Dentition, Missing   Pulmonary asthma , former smoker,    Pulmonary exam normal        Cardiovascular Exercise Tolerance: Good hypertension, (-) angina+ Past MI and + Cardiac Stents  (-) DOE Normal cardiovascular exam     Neuro/Psych negative neurological ROS  negative psych ROS   GI/Hepatic Neg liver ROS, hiatal hernia, GERD  Medicated and Controlled,  Endo/Other  diabetes, Type 2  Renal/GU negative Renal ROS  negative genitourinary   Musculoskeletal  (+) Arthritis ,   Abdominal   Peds  Hematology negative hematology ROS (+)   Anesthesia Other Findings Past Medical History: No date: BPH (benign prostatic hyperplasia) No date: Cancer ([place]) 02/01/04: Delayed gastric emptying     Comment:  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 No date: DM (diabetes mellitus) ([place])     Comment:  resolved No date: GERD (gastroesophageal reflux disease) No date: Gouty arthritis No date: Heart attack ([place]) No date: Hiatal hernia No date: History of kidney stones No date: HTN (hypertension) No date: Hypercholesterolemia  Past Surgical History: 07/07/04: COLONOSCOPY     Comment:  normal/left-side diverticula 07/07/04: ESOPHAGOGASTRODUODENOSCOPY     Comment:  normal/small HH/otherwise normal No date: KIDNEY STONE SURGERY 2013: STAPLE HEMORRHOIDECTOMY     Comment:  Dr Jamal Collin  BMI    Body Mass Index: 22.38 kg/m      Reproductive/Obstetrics negative OB ROS                             Anesthesia Physical Anesthesia Plan  ASA:  III  Anesthesia Plan: General   Post-op Pain Management:    Induction: Intravenous  PONV Risk Score and Plan: Propofol infusion and TIVA  Airway Management Planned: Natural Airway and Nasal Cannula  Additional Equipment:   Intra-op Plan:   Post-operative Plan:   Informed Consent: I have reviewed the patients History and Physical, chart, labs and discussed the procedure including the risks, benefits and alternatives for the proposed anesthesia with the patient or authorized representative who has indicated his/her understanding and acceptance.     Dental Advisory Given  Plan Discussed with: Anesthesiologist, CRNA and Surgeon  Anesthesia Plan Comments: (Patient consented for risks of anesthesia including but not limited to:  - adverse reactions to medications - risk of intubation if required - damage to eyes, teeth, lips or other oral mucosa - nerve damage due to positioning  - sore throat or hoarseness - Damage to heart, brain, nerves, lungs, other parts of body or loss of life  Patient voiced understanding.)        Anesthesia Quick Evaluation

## 2020-03-09 NOTE — Anesthesia Postprocedure Evaluation (Signed)
Anesthesia Post Note  Patient: Thomas Monroe  Procedure(s) Performed: COLONOSCOPY WITH PROPOFOL (N/A ) ESOPHAGOGASTRODUODENOSCOPY (EGD) WITH PROPOFOL (N/A )  Patient location during evaluation: Endoscopy Anesthesia Type: General Level of consciousness: awake and alert Pain management: pain level controlled Vital Signs Assessment: post-procedure vital signs reviewed and stable Respiratory status: spontaneous breathing, nonlabored ventilation, respiratory function stable and patient connected to nasal cannula oxygen Cardiovascular status: blood pressure returned to baseline and stable Postop Assessment: no apparent nausea or vomiting Anesthetic complications: no   No complications documented.   Last Vitals:  Vitals:   03/09/20 1100 03/09/20 1110  BP: 123/75 120/72  Pulse: 85 80  Resp: 11 (!) 21  Temp:    SpO2: 98% 100%    Last Pain:  Vitals:   03/09/20 0907  TempSrc: Temporal                 Thomas Monroe

## 2020-03-09 NOTE — Transfer of Care (Signed)
Immediate Anesthesia Transfer of Care Note  Patient: Thomas Monroe  Procedure(s) Performed: COLONOSCOPY WITH PROPOFOL (N/A ) ESOPHAGOGASTRODUODENOSCOPY (EGD) WITH PROPOFOL (N/A )  Patient Location: Endoscopy Unit  Anesthesia Type:General  Level of Consciousness: awake, drowsy and patient cooperative  Airway & Oxygen Therapy: Patient Spontanous Breathing and Patient connected to face mask oxygen  Post-op Assessment: Report given to RN and Post -op Vital signs reviewed and stable  Post vital signs: Reviewed and stable  Last Vitals:  Vitals Value Taken Time  BP 97/58 03/09/20 1041  Temp    Pulse 87 03/09/20 1044  Resp 23 03/09/20 1044  SpO2 100 % 03/09/20 1044  Vitals shown include unvalidated device data.  Last Pain:  Vitals:   03/09/20 0907  TempSrc: Temporal         Complications: No complications documented.

## 2020-03-10 ENCOUNTER — Encounter: Payer: Self-pay | Admitting: Gastroenterology

## 2020-03-10 LAB — SURGICAL PATHOLOGY

## 2020-08-18 DIAGNOSIS — D508 Other iron deficiency anemias: Secondary | ICD-10-CM | POA: Diagnosis not present

## 2020-08-18 DIAGNOSIS — D519 Vitamin B12 deficiency anemia, unspecified: Secondary | ICD-10-CM | POA: Diagnosis not present

## 2020-08-18 DIAGNOSIS — K229 Disease of esophagus, unspecified: Secondary | ICD-10-CM | POA: Diagnosis not present

## 2020-08-18 DIAGNOSIS — C8330 Diffuse large B-cell lymphoma, unspecified site: Secondary | ICD-10-CM | POA: Diagnosis not present

## 2020-08-18 DIAGNOSIS — I10 Essential (primary) hypertension: Secondary | ICD-10-CM | POA: Diagnosis not present

## 2020-08-18 DIAGNOSIS — E1165 Type 2 diabetes mellitus with hyperglycemia: Secondary | ICD-10-CM | POA: Diagnosis not present

## 2020-08-18 DIAGNOSIS — E538 Deficiency of other specified B group vitamins: Secondary | ICD-10-CM | POA: Diagnosis not present

## 2020-08-18 DIAGNOSIS — E559 Vitamin D deficiency, unspecified: Secondary | ICD-10-CM | POA: Diagnosis not present

## 2020-08-26 DIAGNOSIS — C8330 Diffuse large B-cell lymphoma, unspecified site: Secondary | ICD-10-CM | POA: Diagnosis not present

## 2020-08-26 DIAGNOSIS — I10 Essential (primary) hypertension: Secondary | ICD-10-CM | POA: Diagnosis not present

## 2020-08-26 DIAGNOSIS — D519 Vitamin B12 deficiency anemia, unspecified: Secondary | ICD-10-CM | POA: Diagnosis not present

## 2020-08-26 DIAGNOSIS — M1A9XX0 Chronic gout, unspecified, without tophus (tophi): Secondary | ICD-10-CM | POA: Diagnosis not present

## 2020-08-26 DIAGNOSIS — I2510 Atherosclerotic heart disease of native coronary artery without angina pectoris: Secondary | ICD-10-CM | POA: Diagnosis not present

## 2020-08-26 DIAGNOSIS — I6523 Occlusion and stenosis of bilateral carotid arteries: Secondary | ICD-10-CM | POA: Diagnosis not present

## 2020-08-26 DIAGNOSIS — E1140 Type 2 diabetes mellitus with diabetic neuropathy, unspecified: Secondary | ICD-10-CM | POA: Diagnosis not present

## 2020-08-26 DIAGNOSIS — M818 Other osteoporosis without current pathological fracture: Secondary | ICD-10-CM | POA: Diagnosis not present

## 2020-08-26 DIAGNOSIS — E559 Vitamin D deficiency, unspecified: Secondary | ICD-10-CM | POA: Diagnosis not present

## 2020-08-26 DIAGNOSIS — N401 Enlarged prostate with lower urinary tract symptoms: Secondary | ICD-10-CM | POA: Diagnosis not present

## 2020-09-09 DIAGNOSIS — Z23 Encounter for immunization: Secondary | ICD-10-CM | POA: Diagnosis not present

## 2020-12-08 DIAGNOSIS — D508 Other iron deficiency anemias: Secondary | ICD-10-CM | POA: Diagnosis not present

## 2020-12-08 DIAGNOSIS — E538 Deficiency of other specified B group vitamins: Secondary | ICD-10-CM | POA: Diagnosis not present

## 2020-12-08 DIAGNOSIS — C8330 Diffuse large B-cell lymphoma, unspecified site: Secondary | ICD-10-CM | POA: Diagnosis not present

## 2020-12-08 DIAGNOSIS — K229 Disease of esophagus, unspecified: Secondary | ICD-10-CM | POA: Diagnosis not present

## 2020-12-08 DIAGNOSIS — I2510 Atherosclerotic heart disease of native coronary artery without angina pectoris: Secondary | ICD-10-CM | POA: Diagnosis not present

## 2020-12-08 DIAGNOSIS — E1165 Type 2 diabetes mellitus with hyperglycemia: Secondary | ICD-10-CM | POA: Diagnosis not present

## 2020-12-08 DIAGNOSIS — I10 Essential (primary) hypertension: Secondary | ICD-10-CM | POA: Diagnosis not present

## 2020-12-08 DIAGNOSIS — D519 Vitamin B12 deficiency anemia, unspecified: Secondary | ICD-10-CM | POA: Diagnosis not present

## 2020-12-15 DIAGNOSIS — E7800 Pure hypercholesterolemia, unspecified: Secondary | ICD-10-CM | POA: Diagnosis not present

## 2020-12-15 DIAGNOSIS — C8330 Diffuse large B-cell lymphoma, unspecified site: Secondary | ICD-10-CM | POA: Diagnosis not present

## 2020-12-15 DIAGNOSIS — I441 Atrioventricular block, second degree: Secondary | ICD-10-CM | POA: Diagnosis not present

## 2020-12-15 DIAGNOSIS — I10 Essential (primary) hypertension: Secondary | ICD-10-CM | POA: Diagnosis not present

## 2020-12-15 DIAGNOSIS — E1165 Type 2 diabetes mellitus with hyperglycemia: Secondary | ICD-10-CM | POA: Diagnosis not present

## 2020-12-15 DIAGNOSIS — E1140 Type 2 diabetes mellitus with diabetic neuropathy, unspecified: Secondary | ICD-10-CM | POA: Diagnosis not present

## 2020-12-15 DIAGNOSIS — M818 Other osteoporosis without current pathological fracture: Secondary | ICD-10-CM | POA: Diagnosis not present

## 2020-12-15 DIAGNOSIS — I455 Other specified heart block: Secondary | ICD-10-CM | POA: Diagnosis not present

## 2020-12-15 DIAGNOSIS — I2510 Atherosclerotic heart disease of native coronary artery without angina pectoris: Secondary | ICD-10-CM | POA: Diagnosis not present

## 2020-12-15 DIAGNOSIS — I440 Atrioventricular block, first degree: Secondary | ICD-10-CM | POA: Diagnosis not present

## 2020-12-17 DIAGNOSIS — D519 Vitamin B12 deficiency anemia, unspecified: Secondary | ICD-10-CM | POA: Diagnosis not present

## 2020-12-17 DIAGNOSIS — I2510 Atherosclerotic heart disease of native coronary artery without angina pectoris: Secondary | ICD-10-CM | POA: Diagnosis not present

## 2020-12-17 DIAGNOSIS — E559 Vitamin D deficiency, unspecified: Secondary | ICD-10-CM | POA: Diagnosis not present

## 2020-12-17 DIAGNOSIS — I10 Essential (primary) hypertension: Secondary | ICD-10-CM | POA: Diagnosis not present

## 2020-12-17 DIAGNOSIS — C8330 Diffuse large B-cell lymphoma, unspecified site: Secondary | ICD-10-CM | POA: Diagnosis not present

## 2020-12-17 DIAGNOSIS — I441 Atrioventricular block, second degree: Secondary | ICD-10-CM | POA: Diagnosis not present

## 2021-01-10 DIAGNOSIS — I441 Atrioventricular block, second degree: Secondary | ICD-10-CM | POA: Diagnosis not present

## 2021-01-11 DIAGNOSIS — I10 Essential (primary) hypertension: Secondary | ICD-10-CM | POA: Diagnosis not present

## 2021-01-11 DIAGNOSIS — I441 Atrioventricular block, second degree: Secondary | ICD-10-CM | POA: Diagnosis not present

## 2021-01-11 DIAGNOSIS — E559 Vitamin D deficiency, unspecified: Secondary | ICD-10-CM | POA: Diagnosis not present

## 2021-01-11 DIAGNOSIS — M818 Other osteoporosis without current pathological fracture: Secondary | ICD-10-CM | POA: Diagnosis not present

## 2021-01-11 DIAGNOSIS — M1A9XX0 Chronic gout, unspecified, without tophus (tophi): Secondary | ICD-10-CM | POA: Diagnosis not present

## 2021-01-11 DIAGNOSIS — C8330 Diffuse large B-cell lymphoma, unspecified site: Secondary | ICD-10-CM | POA: Diagnosis not present

## 2021-01-11 DIAGNOSIS — I2510 Atherosclerotic heart disease of native coronary artery without angina pectoris: Secondary | ICD-10-CM | POA: Diagnosis not present

## 2021-01-11 DIAGNOSIS — I6523 Occlusion and stenosis of bilateral carotid arteries: Secondary | ICD-10-CM | POA: Diagnosis not present

## 2021-01-11 DIAGNOSIS — I455 Other specified heart block: Secondary | ICD-10-CM | POA: Diagnosis not present

## 2021-01-14 ENCOUNTER — Ambulatory Visit: Payer: Medicare HMO | Admitting: Internal Medicine

## 2021-01-14 ENCOUNTER — Telehealth: Payer: Self-pay

## 2021-01-14 ENCOUNTER — Encounter: Payer: Self-pay | Admitting: Internal Medicine

## 2021-01-14 ENCOUNTER — Other Ambulatory Visit: Payer: Self-pay

## 2021-01-14 VITALS — BP 124/70 | HR 70 | Ht 72.0 in | Wt 160.8 lb

## 2021-01-14 DIAGNOSIS — I442 Atrioventricular block, complete: Secondary | ICD-10-CM

## 2021-01-14 NOTE — Telephone Encounter (Signed)
Discussed pre procedure instructions with pts wife. He will be NPO at midnight the night before. Will only take omeprazole the morning of the procedure.  Pt will still need to be set up with wound check appt and 91 day follow up. Will forward to Dr. Olin Pia RN in [place] to arrange. Pt will likely be following up in [place] in the future.

## 2021-01-14 NOTE — Telephone Encounter (Signed)
Attempted to contact patient to give pre procedure instructions. Pt is scheduled for PPM with Dr. Quentin Ore on August 17th for a 7:30am procedure time.  He should discontinue his plavix indefinitely per Dr. Caryl Comes. He will need to arrive to [facility] at 6:30am and check in at pt registration.  He already has his surgical soap with instructions. His lab work was drawn on 8/12.  I will attempt to call pt a few more times today, but may need additional follow up on Monday 8/15

## 2021-01-14 NOTE — Progress Notes (Signed)
heare     ELECTROPHYSIOLOGY CONSULT NOTE  Patient ID: Thomas Monroe, MRN: [id number], DOB/AGE: 12/04/1945 75 y.o. Admit date: (Not on file) Date of Consult: 01/14/2021  Primary Physician: [name], MD Primary Cardiologist: Farrell Stiteler is a 75 y.o. male who is being seen today for the evaluation of heart block at the request of heart block.    HPI Thomas Monroe is a 75 y.o. male seen in consultation for intermittent complete heart block.  Patient was noted a month ago at annual physical to be bradycardic.  An event recorder was placed which demonstrated frequent episodes of high-grade heart block including 3 nonconducted P waves during waking hours for total pause of 4 seconds.  The patient denies symptoms of exercise intolerance lightheadedness or syncope.  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   DM (diabetes mellitus) ([place])    resolved   GERD (gastroesophageal reflux disease)    Gouty arthritis    Heart attack ([place])    Hiatal hernia    History of kidney stones    HTN (hypertension)    Hypercholesterolemia       Surgical History:  Past Surgical History:  Procedure Laterality Date   COLONOSCOPY  07/07/04   normal/left-side diverticula   COLONOSCOPY WITH PROPOFOL N/A 03/09/2020   Procedure: COLONOSCOPY WITH PROPOFOL;  Surgeon: Lesly Rubenstein, MD;  Location: [facility] ENDOSCOPY;  Service: Endoscopy;  Laterality: N/A;   ESOPHAGOGASTRODUODENOSCOPY  07/07/04   normal/small HH/otherwise normal   ESOPHAGOGASTRODUODENOSCOPY (EGD) WITH PROPOFOL N/A 03/09/2020   Procedure: ESOPHAGOGASTRODUODENOSCOPY (EGD) WITH PROPOFOL;  Surgeon: Lesly Rubenstein, MD;  Location: [facility] ENDOSCOPY;  Service: Endoscopy;   Laterality: N/A;   KIDNEY STONE SURGERY     STAPLE HEMORRHOIDECTOMY  2013   Dr Thomas Monroe     Home Meds: Current Meds  Medication Sig   aspirin 81 MG tablet Take 81 mg by mouth daily.   atorvastatin (LIPITOR) 40 MG tablet Take 40 mg by mouth daily.   azelastine (ASTELIN) 0.1 % nasal spray Place 1 spray into both nostrils 2 (two) times daily. Use in each nostril as directed   clonazePAM (KLONOPIN) 1 MG tablet Take 1 mg by mouth as needed for anxiety.   clopidogrel (PLAVIX) 75 MG tablet Take 75 mg by mouth daily.    ergocalciferol (VITAMIN D2) 1.25 MG (50000 UT) capsule Take 50,000 Units by mouth once a week.   folic acid (FOLVITE) 1 MG tablet Take 1 mg by mouth daily.    hydrocortisone (ANUSOL-HC) 2.5 % rectal cream Place 1 application rectally 2 (two) times daily.   nitroGLYCERIN (NITROSTAT) 0.4 MG SL tablet Place 0.4 mg under the tongue every 5 (five) minutes as needed for chest pain.   omeprazole (PRILOSEC) 10 MG capsule Take 10 mg by mouth daily.   Tamsulosin HCl (FLOMAX) 0.4 MG CAPS Take 0.4 mg by mouth daily.    triamcinolone cream (KENALOG) 0.1 % Apply 1 application topically 2 (two) times daily.    Allergies:  Allergies  Allergen Reactions   Klonopin [Clonazepam]     Patient says he is not allergic to this  medication    Social History   Socioeconomic History   Marital status: Married    Spouse name: Not on file   Number of children: Not on file   Years of education: Not on file   Highest education level: Not on file  Occupational History   Occupation: [employer]    Comment: [employer]  Tobacco Use   Smoking status: Former   Smokeless tobacco: Never  Substance and Sexual Activity   Alcohol use: No   Drug use: No   Sexual activity: Not on file  Other Topics Concern   Not on file  Social History Narrative   Not on file   Social Determinants of Health   Financial Resource Strain: Not on file  Food Insecurity: Not on file  Transportation Needs: Not on file   Physical Activity: Not on file  Stress: Not on file  Social Connections: Not on file  Intimate Partner Violence: Not on file     Family History  Problem Relation Age of Onset   Colon cancer Neg Hx      ROS:  Please see the history of present illness.     All other systems reviewed and negative.    Physical Exam: Blood pressure 124/70, pulse 70, height 6' (1.829 m), weight 160 lb 12.8 oz (72.9 kg), SpO2 98 %. General: Well developed, well nourished male in no acute distress. Head: Normocephalic, atraumatic, sclera non-icteric, no xanthomas, nares are without discharge. EENT: normal  Lymph Nodes:  none Neck: Negative for carotid bruits. JVD not elevated. Back:without scoliosis kyphosis Lungs: Clear bilaterally to auscultation without wheezes, rales, or rhonchi. Breathing is unlabored. Heart: RRR with S1 S2. No  murmur . No rubs, or gallops appreciated. Abdomen: Soft, non-tender, non-distended with normoactive bowel sounds. No hepatomegaly. No rebound/guarding. No obvious abdominal masses. Msk:  Strength and tone appear normal for age. Extremities: No clubbing or cyanosis. No edema.  Distal pedal pulses are 2+ and equal bilaterally. Skin: Warm and Dry Neuro: Alert and oriented X 3. CN III-XII intact Grossly normal sensory and motor function . Psych:  Responds to questions appropriately with a normal affect.      Labs: Cardiac Enzymes No results for input(s): CKTOTAL, CKMB, TROPONINI in the last 72 hours. CBC Lab Results  Component Value Date   WBC 8.4 07/12/2016   HGB 14.2 07/12/2016   HCT 42.6 07/12/2016   MCV 90.1 07/12/2016   PLT 218 07/12/2016     EKG: atrial fibrillation 70 with a fixed RR relationship on the right head and two thirds of the tracing and the irregularity on the left is associate with a compensatory pause suggestive of complete heart block with a premature junctional extrasystole   Assessment and Plan:  Complete Heart Block  CAD-- s/p PCI   details not available  aTrial fibrillation   Intermittent complete heart block.  Surprisingly without symptoms.  Wonder whether he is minimizing.  With serial nonconducted P waves, I think not withstanding the paucity of symptoms patient is indicated for protection against syncope.  I reviewed this with him and his wife.  They are agreeable.  I reviewed potential benefits as well as risks including but not limited to perforation death infection lead dislodgment.  They are agreeable.  We will plan to hold his Plavix in anticipation of the procedure.  We will plan to not resume.  The pacer will also allow [place] to track atrial fibrillation.  We will hold off on anticoagulation until following the pacemaker  clarifies atrial fibrillation.  Discussed with Dr. Quentin Ore.  He will do the procedure next Wednesday      Virl Axe

## 2021-01-14 NOTE — H&P (View-Only) (Signed)
heare     ELECTROPHYSIOLOGY CONSULT NOTE  Patient ID: Thomas Monroe, MRN: [id number], DOB/AGE: 11-Sep-1945 75 y.o. Admit date: (Not on file) Date of Consult: 01/14/2021  Primary Physician: [name], MD Primary Cardiologist: [name] is a 75 y.o. male who is being seen today for the evaluation of heart block at the request of heart block.    HPI Thomas Monroe is a 75 y.o. male seen in consultation for intermittent complete heart block.  Patient was noted a month ago at annual physical to be bradycardic.  An event recorder was placed which demonstrated frequent episodes of high-grade heart block including 3 nonconducted P waves during waking hours for total pause of 4 seconds.  The patient denies symptoms of exercise intolerance lightheadedness or syncope.  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   DM (diabetes mellitus) ([place])    resolved   GERD (gastroesophageal reflux disease)    Gouty arthritis    Heart attack ([place])    Hiatal hernia    History of kidney stones    HTN (hypertension)    Hypercholesterolemia       Surgical History:  Past Surgical History:  Procedure Laterality Date   COLONOSCOPY  07/07/04   normal/left-side diverticula   COLONOSCOPY WITH PROPOFOL N/A 03/09/2020   Procedure: COLONOSCOPY WITH PROPOFOL;  Surgeon: Lesly Rubenstein, MD;  Location: [facility] ENDOSCOPY;  Service: Endoscopy;  Laterality: N/A;   ESOPHAGOGASTRODUODENOSCOPY  07/07/04   normal/small HH/otherwise normal   ESOPHAGOGASTRODUODENOSCOPY (EGD) WITH PROPOFOL N/A 03/09/2020   Procedure: ESOPHAGOGASTRODUODENOSCOPY (EGD) WITH PROPOFOL;  Surgeon: Lesly Rubenstein, MD;  Location: [facility] ENDOSCOPY;  Service: Endoscopy;   Laterality: N/A;   KIDNEY STONE SURGERY     STAPLE HEMORRHOIDECTOMY  2013   Dr Jamal Collin     Home Meds: Current Meds  Medication Sig   aspirin 81 MG tablet Take 81 mg by mouth daily.   atorvastatin (LIPITOR) 40 MG tablet Take 40 mg by mouth daily.   azelastine (ASTELIN) 0.1 % nasal spray Place 1 spray into both nostrils 2 (two) times daily. Use in each nostril as directed   clonazePAM (KLONOPIN) 1 MG tablet Take 1 mg by mouth as needed for anxiety.   clopidogrel (PLAVIX) 75 MG tablet Take 75 mg by mouth daily.    ergocalciferol (VITAMIN D2) 1.25 MG (50000 UT) capsule Take 50,000 Units by mouth once a week.   folic acid (FOLVITE) 1 MG tablet Take 1 mg by mouth daily.    hydrocortisone (ANUSOL-HC) 2.5 % rectal cream Place 1 application rectally 2 (two) times daily.   nitroGLYCERIN (NITROSTAT) 0.4 MG SL tablet Place 0.4 mg under the tongue every 5 (five) minutes as needed for chest pain.   omeprazole (PRILOSEC) 10 MG capsule Take 10 mg by mouth daily.   Tamsulosin HCl (FLOMAX) 0.4 MG CAPS Take 0.4 mg by mouth daily.    triamcinolone cream (KENALOG) 0.1 % Apply 1 application topically 2 (two) times daily.    Allergies:  Allergies  Allergen Reactions   Klonopin [Clonazepam]     Patient says he is not allergic to this  medication    Social History   Socioeconomic History   Marital status: Married    Spouse name: Not on file   Number of children: Not on file   Years of education: Not on file   Highest education level: Not on file  Occupational History   Occupation: [employer]    Comment: [employer]  Tobacco Use   Smoking status: Former   Smokeless tobacco: Never  Substance and Sexual Activity   Alcohol use: No   Drug use: No   Sexual activity: Not on file  Other Topics Concern   Not on file  Social History Narrative   Not on file   Social Determinants of Health   Financial Resource Strain: Not on file  Food Insecurity: Not on file  Transportation Needs: Not on file   Physical Activity: Not on file  Stress: Not on file  Social Connections: Not on file  Intimate Partner Violence: Not on file     Family History  Problem Relation Age of Onset   Colon cancer Neg Hx      ROS:  Please see the history of present illness.     All other systems reviewed and negative.    Physical Exam: Blood pressure 124/70, pulse 70, height 6' (1.829 m), weight 160 lb 12.8 oz (72.9 kg), SpO2 98 %. General: Well developed, well nourished male in no acute distress. Head: Normocephalic, atraumatic, sclera non-icteric, no xanthomas, nares are without discharge. EENT: normal  Lymph Nodes:  none Neck: Negative for carotid bruits. JVD not elevated. Back:without scoliosis kyphosis Lungs: Clear bilaterally to auscultation without wheezes, rales, or rhonchi. Breathing is unlabored. Heart: RRR with S1 S2. No  murmur . No rubs, or gallops appreciated. Abdomen: Soft, non-tender, non-distended with normoactive bowel sounds. No hepatomegaly. No rebound/guarding. No obvious abdominal masses. Msk:  Strength and tone appear normal for age. Extremities: No clubbing or cyanosis. No edema.  Distal pedal pulses are 2+ and equal bilaterally. Skin: Warm and Dry Neuro: Alert and oriented X 3. CN III-XII intact Grossly normal sensory and motor function . Psych:  Responds to questions appropriately with a normal affect.      Labs: Cardiac Enzymes No results for input(s): CKTOTAL, CKMB, TROPONINI in the last 72 hours. CBC Lab Results  Component Value Date   WBC 8.4 07/12/2016   HGB 14.2 07/12/2016   HCT 42.6 07/12/2016   MCV 90.1 07/12/2016   PLT 218 07/12/2016     EKG: atrial fibrillation 70 with a fixed RR relationship on the right head and two thirds of the tracing and the irregularity on the left is associate with a compensatory pause suggestive of complete heart block with a premature junctional extrasystole   Assessment and Plan:  Complete Heart Block  CAD-- s/p PCI   details not available  aTrial fibrillation   Intermittent complete heart block.  Surprisingly without symptoms.  Wonder whether he is minimizing.  With serial nonconducted P waves, I think not withstanding the paucity of symptoms patient is indicated for protection against syncope.  I reviewed this with him and his wife.  They are agreeable.  I reviewed potential benefits as well as risks including but not limited to perforation death infection lead dislodgment.  They are agreeable.  We will plan to hold his Plavix in anticipation of the procedure.  We will plan to not resume.  The pacer will also allow [place] to track atrial fibrillation.  We will hold off on anticoagulation until following the pacemaker  clarifies atrial fibrillation.  Discussed with Dr. Quentin Ore.  He will do the procedure next Wednesday      Virl Axe

## 2021-01-14 NOTE — Patient Instructions (Signed)
Medication Instructions:  Your physician recommends that you continue on your current medications as directed. Please refer to the Current Medication list given to you today.  Labwork: You will have labs drawn today: CBC and BMP   Testing/Procedures: Your physician has recommended that you have a pacemaker inserted. A pacemaker is a small device that is placed under the skin of your chest or abdomen to help control abnormal heart rhythms. This device uses electrical pulses to prompt the heart to beat at a normal rate. Pacemakers are used to treat heart rhythms that are too slow. Wire (leads) are attached to the pacemaker that goes into the chambers of you heart. This is done in the hospital and usually requires and overnight stay. Please see the instruction sheet given to you today for more information.   Follow-Up:  We will be in contact with you regarding your follow up appointments.  Any Other Special Instructions Will Be Listed Below (If Applicable).     If you need a refill on your cardiac medications before your next appointment, please call your pharmacy.

## 2021-01-14 NOTE — Telephone Encounter (Signed)
Pt requesting a callback before 3:30 pm when he will be at work

## 2021-01-15 LAB — CBC
Hematocrit: 31.8 % — ABNORMAL LOW (ref 37.5–51.0)
Hemoglobin: 10.3 g/dL — ABNORMAL LOW (ref 13.0–17.7)
MCH: 29.1 pg (ref 26.6–33.0)
MCHC: 32.4 g/dL (ref 31.5–35.7)
MCV: 90 fL (ref 79–97)
Platelets: 265 10*3/uL (ref 150–450)
RBC: 3.54 x10E6/uL — ABNORMAL LOW (ref 4.14–5.80)
RDW: 12.9 % (ref 11.6–15.4)
WBC: 7.9 10*3/uL (ref 3.4–10.8)

## 2021-01-15 LAB — BASIC METABOLIC PANEL
BUN/Creatinine Ratio: 16 (ref 10–24)
BUN: 19 mg/dL (ref 8–27)
CO2: 24 mmol/L (ref 20–29)
Calcium: 9.1 mg/dL (ref 8.6–10.2)
Chloride: 102 mmol/L (ref 96–106)
Creatinine, Ser: 1.21 mg/dL (ref 0.76–1.27)
Glucose: 117 mg/dL — ABNORMAL HIGH (ref 65–99)
Potassium: 4.5 mmol/L (ref 3.5–5.2)
Sodium: 143 mmol/L (ref 134–144)
eGFR: 63 mL/min/{1.73_m2} (ref >59)

## 2021-01-17 ENCOUNTER — Telehealth: Payer: Self-pay | Admitting: Internal Medicine

## 2021-01-17 ENCOUNTER — Telehealth: Payer: Self-pay | Admitting: Cardiology

## 2021-01-17 NOTE — Telephone Encounter (Signed)
Returned call to Pt's wife (EC).  Instructions given regarding PPM placement on 8/17.  Pt will see if his work would require FMLA.  Will drop off paperwork to this nurse if so.  Pt wanting to know how much time off work to request.

## 2021-01-17 NOTE — Telephone Encounter (Signed)
Thomas Sprang, MD  01/15/2021  2:44 PM EDT     Please Inform Patient   Labs are normal x New anemia which will need eval  Dr Morayati>> can you followup his anemia and let us know that you have received this message so the ball does not get dropped    Thanks

## 2021-01-17 NOTE — Telephone Encounter (Signed)
Attempted to call the patient. No answer- no voice mail set up.  Will attempt to reach the patient at a later time.

## 2021-01-17 NOTE — Telephone Encounter (Signed)
Patient wife is calling with questions for pre registration for procedure scheduled 01/19/21, please assist

## 2021-01-18 ENCOUNTER — Telehealth: Payer: Self-pay | Admitting: Internal Medicine

## 2021-01-18 NOTE — Telephone Encounter (Signed)
Thomas Monroe is calling stating the insurance company informed her the facility was not listed on the PA for this patient's procedure tomorrow so it is needing to be resubmitted. Please advise.

## 2021-01-19 ENCOUNTER — Encounter: Payer: Self-pay | Admitting: Cardiology

## 2021-01-19 ENCOUNTER — Ambulatory Visit: Payer: Medicare HMO

## 2021-01-19 ENCOUNTER — Telehealth: Payer: Self-pay

## 2021-01-19 ENCOUNTER — Ambulatory Visit
Admission: RE | Admit: 2021-01-19 | Discharge: 2021-01-19 | Disposition: A | Discharge status: Home / Self Care | Payer: Medicare HMO | Source: Ambulatory Visit | Attending: Cardiology | Admitting: Cardiology

## 2021-01-19 ENCOUNTER — Encounter
Admission: RE | Disposition: A | Discharge status: Home / Self Care | Payer: Self-pay | Source: Ambulatory Visit | Attending: Cardiology

## 2021-01-19 DIAGNOSIS — I4891 Unspecified atrial fibrillation: Secondary | ICD-10-CM | POA: Insufficient documentation

## 2021-01-19 DIAGNOSIS — I10 Essential (primary) hypertension: Secondary | ICD-10-CM | POA: Diagnosis not present

## 2021-01-19 DIAGNOSIS — Z7902 Long term (current) use of antithrombotics/antiplatelets: Secondary | ICD-10-CM | POA: Insufficient documentation

## 2021-01-19 DIAGNOSIS — Z888 Allergy status to other drugs, medicaments and biological substances status: Secondary | ICD-10-CM | POA: Diagnosis not present

## 2021-01-19 DIAGNOSIS — K219 Gastro-esophageal reflux disease without esophagitis: Secondary | ICD-10-CM | POA: Diagnosis not present

## 2021-01-19 DIAGNOSIS — E785 Hyperlipidemia, unspecified: Secondary | ICD-10-CM | POA: Diagnosis not present

## 2021-01-19 DIAGNOSIS — Z955 Presence of coronary angioplasty implant and graft: Secondary | ICD-10-CM | POA: Diagnosis not present

## 2021-01-19 DIAGNOSIS — Z950 Presence of cardiac pacemaker: Secondary | ICD-10-CM | POA: Diagnosis not present

## 2021-01-19 DIAGNOSIS — Z7982 Long term (current) use of aspirin: Secondary | ICD-10-CM | POA: Insufficient documentation

## 2021-01-19 DIAGNOSIS — I442 Atrioventricular block, complete: Secondary | ICD-10-CM

## 2021-01-19 DIAGNOSIS — R001 Bradycardia, unspecified: Secondary | ICD-10-CM

## 2021-01-19 DIAGNOSIS — Z79899 Other long term (current) drug therapy: Secondary | ICD-10-CM | POA: Insufficient documentation

## 2021-01-19 DIAGNOSIS — I2510 Atherosclerotic heart disease of native coronary artery without angina pectoris: Secondary | ICD-10-CM

## 2021-01-19 DIAGNOSIS — E119 Type 2 diabetes mellitus without complications: Secondary | ICD-10-CM | POA: Insufficient documentation

## 2021-01-19 HISTORY — PX: PACEMAKER IMPLANT: EP1218

## 2021-01-19 SURGERY — PACEMAKER IMPLANT
Anesthesia: Moderate Sedation

## 2021-01-19 MED ORDER — CEFAZOLIN SODIUM-DEXTROSE 2-4 GM/100ML-% IV SOLN: 2.0000 g | INTRAVENOUS | Status: AC

## 2021-01-19 MED ORDER — POVIDONE-IODINE 10 % EX SWAB: 2.0000 "application " | Freq: Once | CUTANEOUS | Status: DC

## 2021-01-19 MED ORDER — LIDOCAINE HCL 1 % IJ SOLN
INTRAMUSCULAR | Status: AC
  Filled 2021-01-19: qty 40

## 2021-01-19 MED ORDER — ACETAMINOPHEN 500 MG PO TABS
ORAL_TABLET | ORAL | Status: AC
  Administered 2021-01-19: 1000 mg via ORAL
  Filled 2021-01-19: qty 2

## 2021-01-19 MED ORDER — FENTANYL CITRATE (PF) 100 MCG/2ML IJ SOLN
INTRAMUSCULAR | Status: DC | PRN
  Administered 2021-01-19: 25 ug via INTRAVENOUS

## 2021-01-19 MED ORDER — SODIUM CHLORIDE 0.9 % IV SOLN
80.0000 mg | INTRAVENOUS | Status: AC
  Administered 2021-01-19: 80 mg
  Filled 2021-01-19: qty 80

## 2021-01-19 MED ORDER — FENTANYL CITRATE (PF) 100 MCG/2ML IJ SOLN
INTRAMUSCULAR | Status: AC
  Filled 2021-01-19: qty 2

## 2021-01-19 MED ORDER — HEPARIN (PORCINE) IN NACL 1000-0.9 UT/500ML-% IV SOLN
INTRAVENOUS | Status: AC
  Filled 2021-01-19: qty 1000

## 2021-01-19 MED ORDER — ACETAMINOPHEN 325 MG PO TABS: 325.0000 mg | ORAL_TABLET | ORAL | Status: DC | PRN

## 2021-01-19 MED ORDER — LIDOCAINE HCL (PF) 1 % IJ SOLN
INTRAMUSCULAR | Status: DC | PRN
  Administered 2021-01-19: 30 mL

## 2021-01-19 MED ORDER — ONDANSETRON HCL 4 MG/2ML IJ SOLN: 4.0000 mg | Freq: Four times a day (QID) | INTRAMUSCULAR | Status: DC | PRN

## 2021-01-19 MED ORDER — MIDAZOLAM HCL 2 MG/2ML IJ SOLN
INTRAMUSCULAR | Status: DC | PRN
  Administered 2021-01-19: 0.5 mg via INTRAVENOUS

## 2021-01-19 MED ORDER — MIDAZOLAM HCL 2 MG/2ML IJ SOLN
INTRAMUSCULAR | Status: AC
  Filled 2021-01-19: qty 2

## 2021-01-19 MED ORDER — CHLORHEXIDINE GLUCONATE CLOTH 2 % EX PADS: 6.0000 | MEDICATED_PAD | Freq: Every day | CUTANEOUS | Status: DC

## 2021-01-19 MED ORDER — SODIUM CHLORIDE 0.9 % IV SOLN
INTRAVENOUS | Status: DC
  Administered 2021-01-19: 1000 mL via INTRAVENOUS

## 2021-01-19 MED ORDER — HEPARIN (PORCINE) IN NACL 1000-0.9 UT/500ML-% IV SOLN
INTRAVENOUS | Status: DC | PRN
  Administered 2021-01-19: 500 mL

## 2021-01-19 MED ORDER — ACETAMINOPHEN 500 MG PO TABS: 1000.0000 mg | ORAL_TABLET | Freq: Once | ORAL | Status: AC

## 2021-01-19 MED ORDER — CHLORHEXIDINE GLUCONATE 4 % EX LIQD
4.0000 "application " | Freq: Once | CUTANEOUS | Status: DC
  Administered 2021-01-19: 4 via TOPICAL

## 2021-01-19 MED ORDER — CEFAZOLIN SODIUM-DEXTROSE 2-4 GM/100ML-% IV SOLN
INTRAVENOUS | Status: AC
  Administered 2021-01-19: 2 g via INTRAVENOUS
  Filled 2021-01-19: qty 100

## 2021-01-19 SURGICAL SUPPLY — 12 items
CABLE SURG 12 DISP A/V CHANNEL (MISCELLANEOUS) ×2 IMPLANT
DEVICE DSSCT PLSMBLD 3.0S LGHT (MISCELLANEOUS) IMPLANT
ELECT REM PT RETURN 9FT ADLT (ELECTROSURGICAL) ×2
ELECTRODE REM PT RTRN 9FT ADLT (ELECTROSURGICAL) IMPLANT
LEAD TENDRIL MRI 52CM LPA1200M (Lead) ×1 IMPLANT
LEAD TENDRIL MRI 58CM LPA1200M (Lead) ×1 IMPLANT
PACEMAKER ASSURITY DR-RF (Pacemaker) ×1 IMPLANT
PLASMABLADE 3.0S W/LIGHT (MISCELLANEOUS) ×2
SHEATH 8FR PRELUDE SNAP 13 (SHEATH) ×2 IMPLANT
SLING ARM IMMOBILIZER LRG (SOFTGOODS) ×1 IMPLANT
SUT ETHIBOND CT1 BRD #0 30IN (SUTURE) ×2 IMPLANT
TRAY PACEMAKER INSERTION (PACKS) ×2 IMPLANT

## 2021-01-19 NOTE — Progress Notes (Signed)
Patient had arrival time of 06:30 but arrived at hospital at 06:41 with arrival to [facility] at 06:47.  Case scheduled to start at 07:30 dr. Quentin Ore made aware patient's delayed arrival

## 2021-01-19 NOTE — Discharge Summary (Signed)
Discharge Summary    Patient ID: Thomas Monroe MRN: [id number]; DOB: Sep 13, 1945  Admit date: 01/19/2021 Discharge date: 01/20/2021  PCP:  [name], MD   [place] Prov[facility] Cardiologist:  Dr. Quentin Ore    Discharge Diagnoses    Principal Problem:   Intermittent complete heart block ([place]) Active Problems:   Bradycardia   Coronary atherosclerosis of native coronary vessel   HTN (hypertension)   HLD (hyperlipidemia)    Diagnostic Studies/Procedures    PPM implantation _____________   History of Present Illness     Thomas Monroe is a 75 y.o. male with history of intermittent complete heart block, bradycardia, CAD s/p PCI at [facility] 2011, hypertension, hyperlipidemia, DM2, GERD, and seen by cardiology /electrophysiology today for PPM implantation.  He was seen in the office 01/14/2021 by electrophysiology for evaluation of heart block.  It was noted that, a month prior, he was noted to be bradycardic at his annual physical.  Event recorder was placed and demonstrated frequent episodes of high-grade block, including 3 nonconducted P waves during the waking hours with a total pause of 4 seconds.  At the time of his visit, he denied any symptoms of exercise intolerance, lightheadedness, or syncope.  EKG showed atrial fibrillation, ventricular rate 70 bpm.it was noted compensatory pause suggestive of complete heart block with premature junctional extrasystole.  Plan was for insertion of PPM.  Plavix was held in anticipation of the procedure.  Hospital Course     Consultants: None  He presented to [facility] 01/19/2021 for PPM insertion and underwent successful insertion of Saint Jude Assurity MRI dual-chamber implantable pacemaker without complication.  CXR without acute findings. MD has seen pt, stable to discharge.  Both wound care check and EP follow-up to be scheduled for patient.  AVS instructions following PPM provided. Recommendation is that he continue to hold his  Plavix following his procedure.  Did the patient have an acute coronary syndrome (MI, NSTEMI, STEMI, etc) this admission?:  No                               Did the patient have a percutaneous coronary intervention (stent / angioplasty)?:  No.       _____________  Discharge Vitals Blood pressure 136/77, pulse (!) 59, temperature 98.1 F (36.7 C), temperature source Oral, resp. rate (!) 22, height 6' (1.829 m), weight 170 lb (77.1 kg), SpO2 97 %.  Filed Weights   01/19/21 0711  Weight: 170 lb (77.1 kg)    Labs & Radiologic Studies    CBC No results for input(s): WBC, NEUTROABS, HGB, HCT, MCV, PLT in the last 72 hours. Basic Metabolic Panel No results for input(s): NA, K, CL, CO2, GLUCOSE, BUN, CREATININE, CALCIUM, MG, PHOS in the last 72 hours. Liver Function Tests No results for input(s): AST, ALT, ALKPHOS, BILITOT, PROT, ALBUMIN in the last 72 hours. No results for input(s): LIPASE, AMYLASE in the last 72 hours. High Sensitivity Troponin:   No results for input(s): TROPONINIHS in the last 720 hours.  BNP Invalid input(s): POCBNP D-Dimer No results for input(s): DDIMER in the last 72 hours. Hemoglobin A1C No results for input(s): HGBA1C in the last 72 hours. Fasting Lipid Panel No results for input(s): CHOL, HDL, LDLCALC, TRIG, CHOLHDL, LDLDIRECT in the last 72 hours. Thyroid Function Tests No results for input(s): TSH, T4TOTAL, T3FREE, THYROIDAB in the last 72 hours.  Invalid input(s): FREET3 _____________  DG Chest 2 View  Result Date: 01/19/2021 CLINICAL DATA:  Pacemaker placement EXAM: CHEST - 2 VIEW COMPARISON:  07/12/2016 FINDINGS: Left-sided pacemaker with leads over right atrium and right ventricle. No focal opacity or pleural effusion. Stable cardiomediastinal silhouette. No pneumothorax. IMPRESSION: Left pacer placement as above.  No pneumothorax Electronically Signed   By: Donavan Foil M.D.   On: 01/19/2021 22:15   Disposition   Pt is being discharged home  today in good condition.  Follow-up Plans & Appointments     Follow-up Information     [name], MD Follow up.   Specialty: Cardiology Contact information: [id number] [place] [address] [phone]         Wound Care Check Follow up.                Wound care follow-up 8/31 at 3:20PM EP follow-up 11/29 for device check  Discharge Medications   Allergies as of 01/19/2021   No Active Allergies      Medication List     ASK your doctor about these medications    allopurinol 100 MG tablet Commonly known as: ZYLOPRIM Take 100 mg by mouth daily.   aspirin 81 MG tablet Take 81 mg by mouth daily.   atorvastatin 40 MG tablet Commonly known as: LIPITOR Take 40 mg by mouth daily.   budesonide-formoterol 160-4.5 MCG/ACT inhaler Commonly known as: SYMBICORT Inhale 2 puffs into the lungs daily.   clonazePAM 1 MG tablet Commonly known as: KLONOPIN Take 1 mg by mouth as needed for anxiety.   clopidogrel 75 MG tablet Commonly known as: PLAVIX Take 75 mg by mouth daily.   ergocalciferol 1.25 MG (50000 UT) capsule Commonly known as: VITAMIN D2 Take 50,000 Units by mouth once a week.   folic acid 1 MG tablet Commonly known as: FOLVITE Take 1 mg by mouth daily.   hydrochlorothiazide 12.5 MG tablet Commonly known as: HYDRODIURIL Take 12.5 mg by mouth daily.   hydrocortisone 2.5 % rectal cream Commonly known as: ANUSOL-HC Place 1 application rectally 2 (two) times daily.   nitroGLYCERIN 0.4 MG SL tablet Commonly known as: NITROSTAT Place 0.4 mg under the tongue every 5 (five) minutes as needed for chest pain.   omeprazole 20 MG capsule Commonly known as: PRILOSEC Take 20 mg by mouth daily.   tamsulosin 0.4 MG Caps capsule Commonly known as: FLOMAX Take 0.4 mg by mouth daily.           Outstanding Labs/Studies     Duration of Discharge Encounter   Greater than 30 minutes including physician  time.  Signed, Arvil Chaco, PA-C 01/20/2021, 9:51 PM

## 2021-01-19 NOTE — Interval H&P Note (Signed)
History and Physical Interval Note:  01/19/2021 7:01 AM  Thomas Monroe  has presented today for surgery, with the diagnosis of Pacemaker Implant   Bradycardia.  The various methods of treatment have been discussed with the patient and family. After consideration of risks, benefits and other options for treatment, the patient has consented to  Procedure(s): PACEMAKER IMPLANT (N/A) as a surgical intervention.  The patient's history has been reviewed, patient examined, no change in status, stable for surgery.  I have reviewed the patient's chart and labs.  Questions were answered to the patient's satisfaction.    Patient doing well this AM. Procedure discussed with the patient in detail this morning including risks, recovery and he wishes to proceed.  Risks, benefits, alternatives to PPM implantation were discussed in detail with the patient today. The patient understands that the risks include but are not limited to bleeding, infection, pneumothorax, perforation, tamponade, vascular damage, renal failure, MI, stroke, death, and lead dislodgement and wishes to proceed.    Thomas Monroe

## 2021-01-19 NOTE — Telephone Encounter (Signed)
FMLA paperwork received and completed.

## 2021-01-19 NOTE — Discharge Instructions (Addendum)
After Your Pacemaker   You have a [employer] Pacemaker  ACTIVITY Do not lift your arm above shoulder height for 1 week after your procedure. After 7 days, you may progress as below.  You should remove your sling 24 hours after your procedure, unless otherwise instructed by your provider.     Wednesday January 26, 2021  Thursday January 27, 2021 Friday January 28, 2021 Saturday January 29, 2021   Do not lift, push, pull, or carry anything over 10 pounds with the affected arm until 6 weeks (Wednesday March 02, 2021 ) after your procedure.   You may drive AFTER your wound check, unless you have been told otherwise by your provider.   Ask your healthcare provider when you can go back to work   INCISION/Dressing Please hold your Plavix.  If large square, outer bandage is left in place, this can be removed after 24 hours from your procedure. Do not remove steri-strips or glue as below.   Monitor your Pacemaker site for redness, swelling, and drainage. Call the device clinic at [phone] if you experience these symptoms or fever/chills.  If your incision is sealed with Steri-strips or staples, you may shower 10 days after your procedure or when told by your provider. Do not remove the steri-strips or let the shower hit directly on your site. You may wash around your site with soap and water.    Avoid lotions, ointments, or perfumes over your incision until it is well-healed.  You may use a hot tub or a pool AFTER your wound check appointment if the incision is completely closed.  Pacemaker Alerts:  Some alerts are vibratory and others beep. These are NOT emergencies. Please call our office to let us know. If this occurs at night or on weekends, it can wait until the next business day. Send a remote transmission.  If your device is capable of reading fluid status (for heart failure), you will be offered monthly monitoring to review this with you.   DEVICE MANAGEMENT Remote monitoring is  used to monitor your pacemaker from home. This monitoring is scheduled every 91 days by our office. It allows [place] to keep an eye on the functioning of your device to ensure it is working properly. You will routinely see your Electrophysiologist annually (more often if necessary).   You should receive your ID card for your new device in 4-8 weeks. Keep this card with you at all times once received. Consider wearing a medical alert bracelet or necklace.  Your Pacemaker may be MRI compatible. This will be discussed at your next office visit/wound check.  You should avoid contact with strong electric or magnetic fields.   Do not use amateur (ham) radio equipment or electric (arc) welding torches. MP3 player headphones with magnets should not be used. Some devices are safe to use if held at least 12 inches (30 cm) from your Pacemaker. These include power tools, lawn mowers, and speakers. If you are unsure if something is safe to use, ask your health care provider.  When using your cell phone, hold it to the ear that is on the opposite side from the Pacemaker. Do not leave your cell phone in a pocket over the Pacemaker.  You may safely use electric blankets, heating pads, computers, and microwave ovens.  Call the office right away if: You have chest pain. You feel more short of breath than you have felt before. You feel more light-headed than you have felt before. Your incision starts  to open up.  This information is not intended to replace advice given to you by your health care provider. Make sure you discuss any questions you have with your health care provider.

## 2021-01-19 NOTE — Telephone Encounter (Signed)
Message      I will contact [employer] regarding this authorization and ensure everything is showing correctly for this patient.     Thank you,  Alta Corning     I will forward this FYI to Dr. Olin Pia covering RN for her review, upon return to the office.

## 2021-01-20 ENCOUNTER — Encounter: Payer: Self-pay | Admitting: Cardiology

## 2021-01-20 NOTE — Telephone Encounter (Signed)
2nd attempt to call the patient. No answer- no voice mail set up.   The patient did have his PPM implant done on 01/19/21 at [facility].

## 2021-01-21 ENCOUNTER — Encounter: Payer: Self-pay | Admitting: *Deleted

## 2021-01-21 NOTE — Telephone Encounter (Signed)
3rd attempt to reach the patient by phone. No answer- no voice mail.  I have mailed a letter to the patient with his results and Dr. Olin Pia recommendations.  I have asked that he call back with any further questions/ concerns.

## 2021-01-24 ENCOUNTER — Telehealth: Payer: Self-pay | Admitting: Cardiology

## 2021-01-24 NOTE — Telephone Encounter (Signed)
Patient's wife has some questions regarding patient's pacemaker that was implanted last week. Please call to discuss.

## 2021-01-24 NOTE — Telephone Encounter (Signed)
Advised to remove outer dressing from pacemaker.  Advised ok to drive at day 10.  Will need DPR.

## 2021-01-24 NOTE — Telephone Encounter (Signed)
Phone call received from Olivia Mackie (patients wife), has questions about patients after care and device. No DPR on file. Requested if patient could give me permission I could speak with her. States patient is at work right now. Requesting to speak to [place], [place].

## 2021-02-02 ENCOUNTER — Ambulatory Visit: Payer: Medicare HMO

## 2021-02-09 ENCOUNTER — Other Ambulatory Visit: Payer: Self-pay

## 2021-02-09 ENCOUNTER — Ambulatory Visit (INDEPENDENT_AMBULATORY_CARE_PROVIDER_SITE_OTHER): Payer: Medicare HMO

## 2021-02-09 DIAGNOSIS — I442 Atrioventricular block, complete: Secondary | ICD-10-CM

## 2021-02-09 NOTE — Patient Instructions (Signed)
   After Your Pacemaker   Monitor your pacemaker site for redness, swelling, and drainage. Call the device clinic at [phone] if you experience these symptoms or fever/chills.  Your incision was closed with Steri-strips or staples:  You may shower 7 days after your procedure and wash your incision with soap and water. Avoid lotions, ointments, or perfumes over your incision until it is well-healed.  You may use a hot tub or a pool after your wound check appointment if the incision is completely closed.  Do not lift, push or pull greater than 10 pounds with the affected arm until 6 weeks after your procedure. There are no other restrictions in arm movement after your wound check appointment. September 28th  You may drive, unless driving has been restricted by your healthcare providers.  Your Pacemaker is MRI compatible.  Remote monitoring is used to monitor your pacemaker from home. This monitoring is scheduled every 91 days by our office. It allows [place] to keep an eye on the functioning of your device to ensure it is working properly. You will routinely see your Electrophysiologist annually (more often if necessary).

## 2021-02-10 LAB — CUP PACEART INCLINIC DEVICE CHECK
Battery Remaining Longevity: 88 mo
Battery Voltage: 3.04 V
Brady Statistic RA Percent Paced: 34 %
Brady Statistic RV Percent Paced: 48 %
Date Time Interrogation Session: 20220907100800
Implantable Lead Implant Date: 20220817
Implantable Lead Implant Date: 20220817
Implantable Lead Location: 753859
Implantable Lead Location: 753860
Implantable Pulse Generator Implant Date: 20220817
Lead Channel Impedance Value: 462.5 Ohm
Lead Channel Impedance Value: 562.5 Ohm
Lead Channel Pacing Threshold Amplitude: 0.75 V
Lead Channel Pacing Threshold Amplitude: 0.75 V
Lead Channel Pacing Threshold Pulse Width: 0.5 ms
Lead Channel Pacing Threshold Pulse Width: 0.5 ms
Lead Channel Sensing Intrinsic Amplitude: 4.1 mV
Lead Channel Sensing Intrinsic Amplitude: 7.8 mV
Lead Channel Setting Pacing Amplitude: 3.5 V
Lead Channel Setting Pacing Amplitude: 3.5 V
Lead Channel Setting Pacing Pulse Width: 0.5 ms
Lead Channel Setting Sensing Sensitivity: 2 mV
Pulse Gen Model: 2272
Pulse Gen Serial Number: 3942808

## 2021-02-10 NOTE — Progress Notes (Signed)
Wound check appointment. Steri-strips removed. Wound without redness or edema. Incision edges approximated, wound well healed. Normal device function. Thresholds, sensing, and impedances consistent with implant measurements. Device programmed at 3.5V/auto capture programmed on for extra safety margin until 3 month visit. Histogram distribution appropriate for patient and level of activity. 3 AMS episodes noted. Longest and most recent 9/3 for approx 2 hours. A/V rates 640/86. No OAC as per Dr. Caryl Comes await documented episodes on newly implanted device. Will export transmission today for review. Patient educated about wound care, arm mobility, lifting restrictions. Remote monitoring scheduled with next transmission 04/25/21. ROV follow up with Dr. Caryl Comes 05/03/21.

## 2021-02-14 DIAGNOSIS — C8330 Diffuse large B-cell lymphoma, unspecified site: Secondary | ICD-10-CM | POA: Diagnosis not present

## 2021-02-14 DIAGNOSIS — I2510 Atherosclerotic heart disease of native coronary artery without angina pectoris: Secondary | ICD-10-CM | POA: Diagnosis not present

## 2021-02-14 DIAGNOSIS — I455 Other specified heart block: Secondary | ICD-10-CM | POA: Diagnosis not present

## 2021-02-14 DIAGNOSIS — I441 Atrioventricular block, second degree: Secondary | ICD-10-CM | POA: Diagnosis not present

## 2021-02-14 DIAGNOSIS — Z1331 Encounter for screening for depression: Secondary | ICD-10-CM | POA: Diagnosis not present

## 2021-02-14 DIAGNOSIS — I10 Essential (primary) hypertension: Secondary | ICD-10-CM | POA: Diagnosis not present

## 2021-02-14 DIAGNOSIS — Z136 Encounter for screening for cardiovascular disorders: Secondary | ICD-10-CM | POA: Diagnosis not present

## 2021-02-14 DIAGNOSIS — Z1211 Encounter for screening for malignant neoplasm of colon: Secondary | ICD-10-CM | POA: Diagnosis not present

## 2021-02-14 DIAGNOSIS — Z125 Encounter for screening for malignant neoplasm of prostate: Secondary | ICD-10-CM | POA: Diagnosis not present

## 2021-02-14 DIAGNOSIS — Z0001 Encounter for general adult medical examination with abnormal findings: Secondary | ICD-10-CM | POA: Diagnosis not present

## 2021-02-15 NOTE — Telephone Encounter (Signed)
Error

## 2021-02-15 NOTE — Telephone Encounter (Signed)
-----   Message from [name], MD sent at 01/15/2021  2:44 PM EDT ----- Please Inform Patient  Labs are normal x New anemia which will need eval  Dr Morayati>> can you followup his anemia and let us know that you have received this message so the ball does not get dropped   Thanks

## 2021-03-10 DIAGNOSIS — I455 Other specified heart block: Secondary | ICD-10-CM | POA: Diagnosis not present

## 2021-03-10 DIAGNOSIS — I10 Essential (primary) hypertension: Secondary | ICD-10-CM | POA: Diagnosis not present

## 2021-03-10 DIAGNOSIS — I6523 Occlusion and stenosis of bilateral carotid arteries: Secondary | ICD-10-CM | POA: Diagnosis not present

## 2021-03-10 DIAGNOSIS — I441 Atrioventricular block, second degree: Secondary | ICD-10-CM | POA: Diagnosis not present

## 2021-03-10 DIAGNOSIS — R0989 Other specified symptoms and signs involving the circulatory and respiratory systems: Secondary | ICD-10-CM | POA: Diagnosis not present

## 2021-03-10 DIAGNOSIS — I2510 Atherosclerotic heart disease of native coronary artery without angina pectoris: Secondary | ICD-10-CM | POA: Diagnosis not present

## 2021-03-25 DIAGNOSIS — M818 Other osteoporosis without current pathological fracture: Secondary | ICD-10-CM | POA: Diagnosis not present

## 2021-03-25 DIAGNOSIS — I441 Atrioventricular block, second degree: Secondary | ICD-10-CM | POA: Diagnosis not present

## 2021-03-25 DIAGNOSIS — Z23 Encounter for immunization: Secondary | ICD-10-CM | POA: Diagnosis not present

## 2021-03-25 DIAGNOSIS — I6523 Occlusion and stenosis of bilateral carotid arteries: Secondary | ICD-10-CM | POA: Diagnosis not present

## 2021-03-25 DIAGNOSIS — Z950 Presence of cardiac pacemaker: Secondary | ICD-10-CM | POA: Diagnosis not present

## 2021-03-25 DIAGNOSIS — C8330 Diffuse large B-cell lymphoma, unspecified site: Secondary | ICD-10-CM | POA: Diagnosis not present

## 2021-03-25 DIAGNOSIS — I455 Other specified heart block: Secondary | ICD-10-CM | POA: Diagnosis not present

## 2021-03-25 DIAGNOSIS — I2510 Atherosclerotic heart disease of native coronary artery without angina pectoris: Secondary | ICD-10-CM | POA: Diagnosis not present

## 2021-03-25 DIAGNOSIS — E7800 Pure hypercholesterolemia, unspecified: Secondary | ICD-10-CM | POA: Diagnosis not present

## 2021-04-05 ENCOUNTER — Telehealth: Payer: Self-pay

## 2021-04-05 NOTE — Telephone Encounter (Signed)
I called the patient to get a transmission with his home monitor for Thomas Monroe and Thomas Monroe. No answer/no voicemail.

## 2021-04-05 NOTE — Telephone Encounter (Signed)
-----   Message from Benedetto Goad, RN sent at 04/05/2021  8:17 AM EDT ----- Can you please assist patient with sending manual transmission and let dev triage know when done. Thanks, [employer]

## 2021-04-06 NOTE — Telephone Encounter (Signed)
I spoke with the patient wife and she states she will have him send the transmission when he gets off work.

## 2021-04-07 NOTE — Telephone Encounter (Signed)
Dr. Caryl Comes asked me to get a transmission from this patient. Transmission received 04/06/2021  [name], MD  Vickie Epley, MD; Thora Lance, RN; Douglass Rivers D, CMA C  seeing this late :(( ;  I will get him to retransmit to see if anything longer,  I am scheduled to see in a few weeks  Thanks SK           Previous Messages   ----- Message -----  From: Vickie Epley, MD  Sent: 02/14/2021   9:47 PM EDT  To: Stanton Kidney, RN, Darrell Jewel, RN, *   Hey Team,  This patient was referred to me from Dr Caryl Comes for a pacemaker. Device is functioning well but it has detected several episodes of AF that Dr Caryl Comes wanted to get an accurate count of after the device was put in. Longest episode lasted 2 hours.   Richardson Landry, are you able to chat with the patient or see in clinic? I am also happy to assist if that would work better.   Just let me know.   Thanks,   Lysbeth Galas

## 2021-04-07 NOTE — Telephone Encounter (Signed)
Transmission received 04/06/2021.

## 2021-04-07 NOTE — Telephone Encounter (Signed)
Manual transmission received- Since 02/09/21, pt has only had 2 AMS episodes both were 8 seconds long, most recent one was 03/30/21.

## 2021-04-25 ENCOUNTER — Ambulatory Visit (INDEPENDENT_AMBULATORY_CARE_PROVIDER_SITE_OTHER): Payer: Medicare HMO

## 2021-04-25 DIAGNOSIS — I442 Atrioventricular block, complete: Secondary | ICD-10-CM | POA: Diagnosis not present

## 2021-04-25 LAB — CUP PACEART REMOTE DEVICE CHECK
Battery Remaining Longevity: 74 mo
Battery Remaining Percentage: 95.5 %
Battery Voltage: 3.01 V
Brady Statistic AP VP Percent: 31 %
Brady Statistic AP VS Percent: 4 %
Brady Statistic AS VP Percent: 13 %
Brady Statistic AS VS Percent: 53 %
Brady Statistic RA Percent Paced: 34 %
Brady Statistic RV Percent Paced: 43 %
Date Time Interrogation Session: 20221121020013
Implantable Lead Implant Date: 20220817
Implantable Lead Implant Date: 20220817
Implantable Lead Location: 753859
Implantable Lead Location: 753860
Implantable Pulse Generator Implant Date: 20220817
Lead Channel Impedance Value: 430 Ohm
Lead Channel Impedance Value: 550 Ohm
Lead Channel Pacing Threshold Amplitude: 0.75 V
Lead Channel Pacing Threshold Amplitude: 0.75 V
Lead Channel Pacing Threshold Pulse Width: 0.5 ms
Lead Channel Pacing Threshold Pulse Width: 0.5 ms
Lead Channel Sensing Intrinsic Amplitude: 2.5 mV
Lead Channel Sensing Intrinsic Amplitude: 7.2 mV
Lead Channel Setting Pacing Amplitude: 3.5 V
Lead Channel Setting Pacing Amplitude: 3.5 V
Lead Channel Setting Pacing Pulse Width: 0.5 ms
Lead Channel Setting Sensing Sensitivity: 2 mV
Pulse Gen Model: 2272
Pulse Gen Serial Number: 3942808

## 2021-05-03 ENCOUNTER — Other Ambulatory Visit: Payer: Self-pay

## 2021-05-03 ENCOUNTER — Encounter: Payer: Self-pay | Admitting: Internal Medicine

## 2021-05-03 ENCOUNTER — Ambulatory Visit (INDEPENDENT_AMBULATORY_CARE_PROVIDER_SITE_OTHER): Payer: Medicare HMO | Admitting: Internal Medicine

## 2021-05-03 VITALS — BP 100/72 | HR 62 | Ht 72.0 in | Wt 181.0 lb

## 2021-05-03 DIAGNOSIS — Z950 Presence of cardiac pacemaker: Secondary | ICD-10-CM

## 2021-05-03 DIAGNOSIS — I442 Atrioventricular block, complete: Secondary | ICD-10-CM | POA: Diagnosis not present

## 2021-05-03 LAB — PACEMAKER DEVICE OBSERVATION

## 2021-05-03 NOTE — Patient Instructions (Signed)
Medication Instructions:  - Your physician recommends that you continue on your current medications as directed. Please refer to the Current Medication list given to you today.  *If you need a refill on your cardiac medications before your next appointment, please call your pharmacy*   Lab Work: - none ordered  If you have labs (blood work) drawn today and your tests are completely normal, you will receive your results only by: [place] (if you have MyChart) OR A paper copy in the mail If you have any lab test that is abnormal or we need to change your treatment, we will call you to review the results.   Testing/Procedures: - none ordered   Follow-Up: At [facility], you and your health needs are our priority.  As part of our continuing mission to provide you with exceptional heart care, we have created designated Provider Care Teams.  These Care Teams include your primary Cardiologist (physician) and Advanced Practice Providers (APPs -  Physician Assistants and Nurse Practitioners) who all work together to provide you with the care you need, when you need it.  We recommend signing up for the patient portal called "MyChart".  Sign up information is provided on this After Visit Summary.  MyChart is used to connect with patients for Virtual Visits (Telemedicine).  Patients are able to view lab/test results, encounter notes, upcoming appointments, etc.  Non-urgent messages can be sent to your provider as well.   To learn more about what you can do with MyChart, go to [email or web address].    Your next appointment:   9 month(s)  The format for your next appointment:   In Person  Provider:   Virl Axe, MD    Other Instructions N/a

## 2021-05-03 NOTE — Progress Notes (Signed)
Patient Care Team: [name], MD as PCP - General (Radiology) Daneil Dolin, MD (Gastroenterology)   HPI  Thomas Monroe is a 75 y.o. male seen in follow-up for pacing 8/22 (CL) Abbott for intermittent complete heart block (up to 3 nonconducted P waves in a row) associated with a paucity of symptoms  However, following pacing, he notices more energy, and less dizziness. The patient denies chest pain, shortness of breath, nocturnal dyspnea, orthopnea or peripheral edema.  There have been no palpitations  or syncope.   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   DM (diabetes mellitus) ([place])    resolved   GERD (gastroesophageal reflux disease)    Gouty arthritis    Heart attack ([place])    Hiatal hernia    History of kidney stones    HTN (hypertension)    Hypercholesterolemia     Past Surgical History:  Procedure Laterality Date   COLONOSCOPY  07/07/04   normal/left-side diverticula   COLONOSCOPY WITH PROPOFOL N/A 03/09/2020   Procedure: COLONOSCOPY WITH PROPOFOL;  Surgeon: Lesly Rubenstein, MD;  Location: [facility] ENDOSCOPY;  Service: Endoscopy;  Laterality: N/A;   ESOPHAGOGASTRODUODENOSCOPY  07/07/04   normal/small HH/otherwise normal   ESOPHAGOGASTRODUODENOSCOPY (EGD) WITH PROPOFOL N/A 03/09/2020   Procedure: ESOPHAGOGASTRODUODENOSCOPY (EGD) WITH PROPOFOL;  Surgeon: Lesly Rubenstein, MD;  Location: [facility] ENDOSCOPY;  Service: Endoscopy;  Laterality: N/A;   KIDNEY STONE SURGERY     PACEMAKER IMPLANT N/A 01/19/2021   Procedure: PACEMAKER IMPLANT;   Surgeon: Vickie Epley, MD;  Location: [place] CV LAB;  Service: Cardiovascular;  Laterality: N/A;   STAPLE HEMORRHOIDECTOMY  2013   Dr Jamal Collin    Current Meds  Medication Sig   allopurinol (ZYLOPRIM) 100 MG tablet Take 100 mg by mouth daily.   aspirin 81 MG tablet Take 81 mg by mouth daily.   atorvastatin (LIPITOR) 40 MG tablet Take 40 mg by mouth daily.   budesonide-formoterol (SYMBICORT) 160-4.5 MCG/ACT inhaler Inhale 2 puffs into the lungs daily.   clonazePAM (KLONOPIN) 1 MG tablet Take 1 mg by mouth as needed for anxiety.   ergocalciferol (VITAMIN D2) 1.25 MG (50000 UT) capsule Take 50,000 Units by mouth once a week.   folic acid (FOLVITE) 1 MG tablet Take 1 mg by mouth daily.    hydrochlorothiazide (HYDRODIURIL) 12.5 MG tablet Take 12.5 mg by mouth daily.   nitroGLYCERIN (NITROSTAT) 0.4 MG SL tablet Place 0.4 mg under the tongue every 5 (five) minutes as needed for chest pain.   omeprazole (PRILOSEC) 20 MG capsule Take 20 mg by mouth daily.   Tamsulosin HCl (FLOMAX) 0.4 MG CAPS Take 0.4 mg by mouth daily.     No Active Allergies    Review of Systems negative except from HPI and PMH  Physical Exam BP 100/72 (BP Location: Left Arm, Patient Position: Sitting, Cuff Size: Normal)   Pulse 62   Ht 6' (1.829 m)  Wt 181 lb (82.1 kg)   SpO2 97%   BMI 24.55 kg/m  Well developed and well nourished in no acute distress HENT normal E scleral and icterus clear Neck Supple JVP flat; carotids brisk and full Clear to ausculation Device pocket well healed; without hematoma or erythema.  There is no tethering Regular rate and rhythm, no murmurs gallops or rub Soft with active bowel sounds No clubbing cyanosis  Edema Alert and oriented, grossly normal motor and sensory function Skin Warm and Dry  ECG sinus @ 62 30/09/40  CrCl cannot be calculated (Patient's most recent lab result is older than the maximum 21 days allowed.).   Assessment and  Plan  Complete Heart  Block intermittent   CAD-- s/p PCI  details not available   Atrial fibrillation paroxysmal  Pacemaker-Abbott  Ventricular pacing is about 44% with VIP on it 150 ms. Current medicines are reviewed at length with the patient today .  The patient does not  have concerns regarding medicines.  Paroxysms of atrial fibrillation but constitute SCAF; longest duration was about 2 hours.  No ischemia.  Continue aspirin therapy 81 and atorvastatin 40; will check LDL

## 2021-05-04 NOTE — Progress Notes (Signed)
Remote pacemaker transmission.   

## 2021-05-16 DIAGNOSIS — M818 Other osteoporosis without current pathological fracture: Secondary | ICD-10-CM | POA: Diagnosis not present

## 2021-05-16 DIAGNOSIS — D519 Vitamin B12 deficiency anemia, unspecified: Secondary | ICD-10-CM | POA: Diagnosis not present

## 2021-05-16 DIAGNOSIS — D508 Other iron deficiency anemias: Secondary | ICD-10-CM | POA: Diagnosis not present

## 2021-05-16 DIAGNOSIS — I10 Essential (primary) hypertension: Secondary | ICD-10-CM | POA: Diagnosis not present

## 2021-05-16 DIAGNOSIS — E559 Vitamin D deficiency, unspecified: Secondary | ICD-10-CM | POA: Diagnosis not present

## 2021-05-16 DIAGNOSIS — I2510 Atherosclerotic heart disease of native coronary artery without angina pectoris: Secondary | ICD-10-CM | POA: Diagnosis not present

## 2021-05-16 DIAGNOSIS — C8330 Diffuse large B-cell lymphoma, unspecified site: Secondary | ICD-10-CM | POA: Diagnosis not present

## 2021-05-16 DIAGNOSIS — E1165 Type 2 diabetes mellitus with hyperglycemia: Secondary | ICD-10-CM | POA: Diagnosis not present

## 2021-05-23 DIAGNOSIS — J45901 Unspecified asthma with (acute) exacerbation: Secondary | ICD-10-CM | POA: Diagnosis not present

## 2021-05-23 DIAGNOSIS — I10 Essential (primary) hypertension: Secondary | ICD-10-CM | POA: Diagnosis not present

## 2021-05-23 DIAGNOSIS — N401 Enlarged prostate with lower urinary tract symptoms: Secondary | ICD-10-CM | POA: Diagnosis not present

## 2021-05-23 DIAGNOSIS — M1A9XX0 Chronic gout, unspecified, without tophus (tophi): Secondary | ICD-10-CM | POA: Diagnosis not present

## 2021-05-23 DIAGNOSIS — I2510 Atherosclerotic heart disease of native coronary artery without angina pectoris: Secondary | ICD-10-CM | POA: Diagnosis not present

## 2021-05-23 DIAGNOSIS — E7800 Pure hypercholesterolemia, unspecified: Secondary | ICD-10-CM | POA: Diagnosis not present

## 2021-05-23 DIAGNOSIS — I441 Atrioventricular block, second degree: Secondary | ICD-10-CM | POA: Diagnosis not present

## 2021-05-23 DIAGNOSIS — I6523 Occlusion and stenosis of bilateral carotid arteries: Secondary | ICD-10-CM | POA: Diagnosis not present

## 2021-05-23 DIAGNOSIS — E538 Deficiency of other specified B group vitamins: Secondary | ICD-10-CM | POA: Diagnosis not present

## 2021-07-19 DIAGNOSIS — H2513 Age-related nuclear cataract, bilateral: Secondary | ICD-10-CM | POA: Diagnosis not present

## 2021-07-19 DIAGNOSIS — H18593 Other hereditary corneal dystrophies, bilateral: Secondary | ICD-10-CM | POA: Diagnosis not present

## 2021-07-19 DIAGNOSIS — Z0100 Encounter for examination of eyes and vision without abnormal findings: Secondary | ICD-10-CM | POA: Diagnosis not present

## 2021-07-19 DIAGNOSIS — H5203 Hypermetropia, bilateral: Secondary | ICD-10-CM | POA: Diagnosis not present

## 2021-07-25 ENCOUNTER — Ambulatory Visit (INDEPENDENT_AMBULATORY_CARE_PROVIDER_SITE_OTHER): Payer: Medicare HMO

## 2021-07-25 DIAGNOSIS — I442 Atrioventricular block, complete: Secondary | ICD-10-CM

## 2021-07-25 LAB — CUP PACEART REMOTE DEVICE CHECK
Battery Remaining Longevity: 119 mo
Battery Remaining Percentage: 95.5 %
Battery Voltage: 3.02 V
Brady Statistic AP VP Percent: 36 %
Brady Statistic AP VS Percent: 1 %
Brady Statistic AS VP Percent: 17 %
Brady Statistic AS VS Percent: 46 %
Brady Statistic RA Percent Paced: 37 %
Brady Statistic RV Percent Paced: 53 %
Date Time Interrogation Session: 20230219202325
Implantable Lead Implant Date: 20220817
Implantable Lead Implant Date: 20220817
Implantable Lead Location: 753859
Implantable Lead Location: 753860
Implantable Pulse Generator Implant Date: 20220817
Lead Channel Impedance Value: 440 Ohm
Lead Channel Impedance Value: 550 Ohm
Lead Channel Pacing Threshold Amplitude: 0.625 V
Lead Channel Pacing Threshold Amplitude: 1 V
Lead Channel Pacing Threshold Pulse Width: 0.5 ms
Lead Channel Pacing Threshold Pulse Width: 0.5 ms
Lead Channel Sensing Intrinsic Amplitude: 2.8 mV
Lead Channel Sensing Intrinsic Amplitude: 7.8 mV
Lead Channel Setting Pacing Amplitude: 0.875
Lead Channel Setting Pacing Amplitude: 2 V
Lead Channel Setting Pacing Pulse Width: 0.5 ms
Lead Channel Setting Sensing Sensitivity: 2 mV
Pulse Gen Model: 2272
Pulse Gen Serial Number: 3942808

## 2021-08-01 NOTE — Progress Notes (Signed)
Remote pacemaker transmission.   

## 2021-08-22 DIAGNOSIS — I2510 Atherosclerotic heart disease of native coronary artery without angina pectoris: Secondary | ICD-10-CM | POA: Diagnosis not present

## 2021-08-22 DIAGNOSIS — C8330 Diffuse large B-cell lymphoma, unspecified site: Secondary | ICD-10-CM | POA: Diagnosis not present

## 2021-08-22 DIAGNOSIS — D519 Vitamin B12 deficiency anemia, unspecified: Secondary | ICD-10-CM | POA: Diagnosis not present

## 2021-08-22 DIAGNOSIS — E1165 Type 2 diabetes mellitus with hyperglycemia: Secondary | ICD-10-CM | POA: Diagnosis not present

## 2021-08-22 DIAGNOSIS — E538 Deficiency of other specified B group vitamins: Secondary | ICD-10-CM | POA: Diagnosis not present

## 2021-08-22 DIAGNOSIS — M818 Other osteoporosis without current pathological fracture: Secondary | ICD-10-CM | POA: Diagnosis not present

## 2021-08-22 DIAGNOSIS — E559 Vitamin D deficiency, unspecified: Secondary | ICD-10-CM | POA: Diagnosis not present

## 2021-08-22 DIAGNOSIS — I10 Essential (primary) hypertension: Secondary | ICD-10-CM | POA: Diagnosis not present

## 2021-08-29 DIAGNOSIS — J45901 Unspecified asthma with (acute) exacerbation: Secondary | ICD-10-CM | POA: Diagnosis not present

## 2021-08-29 DIAGNOSIS — C8330 Diffuse large B-cell lymphoma, unspecified site: Secondary | ICD-10-CM | POA: Diagnosis not present

## 2021-08-29 DIAGNOSIS — I10 Essential (primary) hypertension: Secondary | ICD-10-CM | POA: Diagnosis not present

## 2021-08-29 DIAGNOSIS — R079 Chest pain, unspecified: Secondary | ICD-10-CM | POA: Diagnosis not present

## 2021-08-29 DIAGNOSIS — I2510 Atherosclerotic heart disease of native coronary artery without angina pectoris: Secondary | ICD-10-CM | POA: Diagnosis not present

## 2021-08-29 DIAGNOSIS — I455 Other specified heart block: Secondary | ICD-10-CM | POA: Diagnosis not present

## 2021-08-29 DIAGNOSIS — I6523 Occlusion and stenosis of bilateral carotid arteries: Secondary | ICD-10-CM | POA: Diagnosis not present

## 2021-08-29 DIAGNOSIS — Z950 Presence of cardiac pacemaker: Secondary | ICD-10-CM | POA: Diagnosis not present

## 2021-08-29 DIAGNOSIS — E7800 Pure hypercholesterolemia, unspecified: Secondary | ICD-10-CM | POA: Diagnosis not present

## 2021-09-21 DIAGNOSIS — K900 Celiac disease: Secondary | ICD-10-CM | POA: Diagnosis not present

## 2021-09-21 DIAGNOSIS — Z1211 Encounter for screening for malignant neoplasm of colon: Secondary | ICD-10-CM | POA: Diagnosis not present

## 2021-10-24 ENCOUNTER — Ambulatory Visit (INDEPENDENT_AMBULATORY_CARE_PROVIDER_SITE_OTHER): Payer: Medicare HMO

## 2021-10-24 DIAGNOSIS — I442 Atrioventricular block, complete: Secondary | ICD-10-CM

## 2021-10-25 LAB — CUP PACEART REMOTE DEVICE CHECK
Battery Remaining Longevity: 116 mo
Battery Remaining Percentage: 95 %
Battery Voltage: 3.01 V
Brady Statistic AP VP Percent: 32 %
Brady Statistic AP VS Percent: 1 %
Brady Statistic AS VP Percent: 17 %
Brady Statistic AS VS Percent: 50 %
Brady Statistic RA Percent Paced: 33 %
Brady Statistic RV Percent Paced: 49 %
Date Time Interrogation Session: 20230522020017
Implantable Lead Implant Date: 20220817
Implantable Lead Implant Date: 20220817
Implantable Lead Location: 753859
Implantable Lead Location: 753860
Implantable Pulse Generator Implant Date: 20220817
Lead Channel Impedance Value: 440 Ohm
Lead Channel Impedance Value: 540 Ohm
Lead Channel Pacing Threshold Amplitude: 0.625 V
Lead Channel Pacing Threshold Amplitude: 1 V
Lead Channel Pacing Threshold Pulse Width: 0.5 ms
Lead Channel Pacing Threshold Pulse Width: 0.5 ms
Lead Channel Sensing Intrinsic Amplitude: 2.9 mV
Lead Channel Sensing Intrinsic Amplitude: 9.1 mV
Lead Channel Setting Pacing Amplitude: 0.875
Lead Channel Setting Pacing Amplitude: 2 V
Lead Channel Setting Pacing Pulse Width: 0.5 ms
Lead Channel Setting Sensing Sensitivity: 2 mV
Pulse Gen Model: 2272
Pulse Gen Serial Number: 3942808

## 2021-11-09 NOTE — Progress Notes (Signed)
Remote pacemaker transmission.   

## 2021-12-14 ENCOUNTER — Encounter: Payer: Self-pay | Admitting: Internal Medicine

## 2021-12-15 DIAGNOSIS — I10 Essential (primary) hypertension: Secondary | ICD-10-CM | POA: Diagnosis not present

## 2021-12-15 DIAGNOSIS — C8330 Diffuse large B-cell lymphoma, unspecified site: Secondary | ICD-10-CM | POA: Diagnosis not present

## 2021-12-15 DIAGNOSIS — I2510 Atherosclerotic heart disease of native coronary artery without angina pectoris: Secondary | ICD-10-CM | POA: Diagnosis not present

## 2021-12-15 DIAGNOSIS — Z125 Encounter for screening for malignant neoplasm of prostate: Secondary | ICD-10-CM | POA: Diagnosis not present

## 2021-12-15 DIAGNOSIS — E538 Deficiency of other specified B group vitamins: Secondary | ICD-10-CM | POA: Diagnosis not present

## 2021-12-15 DIAGNOSIS — E559 Vitamin D deficiency, unspecified: Secondary | ICD-10-CM | POA: Diagnosis not present

## 2021-12-15 DIAGNOSIS — D519 Vitamin B12 deficiency anemia, unspecified: Secondary | ICD-10-CM | POA: Diagnosis not present

## 2021-12-15 DIAGNOSIS — E1165 Type 2 diabetes mellitus with hyperglycemia: Secondary | ICD-10-CM | POA: Diagnosis not present

## 2021-12-28 DIAGNOSIS — E1140 Type 2 diabetes mellitus with diabetic neuropathy, unspecified: Secondary | ICD-10-CM | POA: Diagnosis not present

## 2021-12-28 DIAGNOSIS — I6523 Occlusion and stenosis of bilateral carotid arteries: Secondary | ICD-10-CM | POA: Diagnosis not present

## 2021-12-28 DIAGNOSIS — M1A9XX0 Chronic gout, unspecified, without tophus (tophi): Secondary | ICD-10-CM | POA: Diagnosis not present

## 2021-12-28 DIAGNOSIS — I441 Atrioventricular block, second degree: Secondary | ICD-10-CM | POA: Diagnosis not present

## 2021-12-28 DIAGNOSIS — N401 Enlarged prostate with lower urinary tract symptoms: Secondary | ICD-10-CM | POA: Diagnosis not present

## 2021-12-28 DIAGNOSIS — Z950 Presence of cardiac pacemaker: Secondary | ICD-10-CM | POA: Diagnosis not present

## 2021-12-28 DIAGNOSIS — I2510 Atherosclerotic heart disease of native coronary artery without angina pectoris: Secondary | ICD-10-CM | POA: Diagnosis not present

## 2021-12-28 DIAGNOSIS — E7800 Pure hypercholesterolemia, unspecified: Secondary | ICD-10-CM | POA: Diagnosis not present

## 2021-12-28 DIAGNOSIS — C8330 Diffuse large B-cell lymphoma, unspecified site: Secondary | ICD-10-CM | POA: Diagnosis not present

## 2021-12-28 DIAGNOSIS — I455 Other specified heart block: Secondary | ICD-10-CM | POA: Diagnosis not present

## 2021-12-28 DIAGNOSIS — I10 Essential (primary) hypertension: Secondary | ICD-10-CM | POA: Diagnosis not present

## 2021-12-28 DIAGNOSIS — M818 Other osteoporosis without current pathological fracture: Secondary | ICD-10-CM | POA: Diagnosis not present

## 2022-01-02 ENCOUNTER — Ambulatory Visit: Payer: Medicare HMO | Admitting: Anesthesiology

## 2022-01-02 ENCOUNTER — Ambulatory Visit
Admission: RE | Admit: 2022-01-02 | Discharge: 2022-01-02 | Disposition: A | Discharge status: Home / Self Care | Payer: Medicare HMO | Source: Ambulatory Visit | Attending: Gastroenterology | Admitting: Gastroenterology

## 2022-01-02 ENCOUNTER — Encounter
Admission: RE | Disposition: A | Discharge status: Home / Self Care | Payer: Self-pay | Source: Ambulatory Visit | Attending: Gastroenterology

## 2022-01-02 ENCOUNTER — Encounter: Payer: Self-pay | Admitting: Anesthesiology

## 2022-01-02 DIAGNOSIS — I2510 Atherosclerotic heart disease of native coronary artery without angina pectoris: Secondary | ICD-10-CM | POA: Diagnosis not present

## 2022-01-02 DIAGNOSIS — I10 Essential (primary) hypertension: Secondary | ICD-10-CM | POA: Diagnosis not present

## 2022-01-02 DIAGNOSIS — Z87891 Personal history of nicotine dependence: Secondary | ICD-10-CM | POA: Diagnosis not present

## 2022-01-02 DIAGNOSIS — D122 Benign neoplasm of ascending colon: Secondary | ICD-10-CM | POA: Diagnosis not present

## 2022-01-02 DIAGNOSIS — E7800 Pure hypercholesterolemia, unspecified: Secondary | ICD-10-CM | POA: Diagnosis not present

## 2022-01-02 DIAGNOSIS — D175 Benign lipomatous neoplasm of intra-abdominal organs: Secondary | ICD-10-CM | POA: Insufficient documentation

## 2022-01-02 DIAGNOSIS — K5730 Diverticulosis of large intestine without perforation or abscess without bleeding: Secondary | ICD-10-CM | POA: Diagnosis not present

## 2022-01-02 DIAGNOSIS — Z1211 Encounter for screening for malignant neoplasm of colon: Secondary | ICD-10-CM | POA: Diagnosis not present

## 2022-01-02 DIAGNOSIS — N400 Enlarged prostate without lower urinary tract symptoms: Secondary | ICD-10-CM | POA: Insufficient documentation

## 2022-01-02 DIAGNOSIS — K219 Gastro-esophageal reflux disease without esophagitis: Secondary | ICD-10-CM | POA: Insufficient documentation

## 2022-01-02 DIAGNOSIS — I252 Old myocardial infarction: Secondary | ICD-10-CM | POA: Diagnosis not present

## 2022-01-02 DIAGNOSIS — K3189 Other diseases of stomach and duodenum: Secondary | ICD-10-CM | POA: Diagnosis not present

## 2022-01-02 DIAGNOSIS — K900 Celiac disease: Secondary | ICD-10-CM | POA: Insufficient documentation

## 2022-01-02 DIAGNOSIS — G709 Myoneural disorder, unspecified: Secondary | ICD-10-CM | POA: Diagnosis not present

## 2022-01-02 DIAGNOSIS — K640 First degree hemorrhoids: Secondary | ICD-10-CM | POA: Diagnosis not present

## 2022-01-02 DIAGNOSIS — E119 Type 2 diabetes mellitus without complications: Secondary | ICD-10-CM | POA: Diagnosis not present

## 2022-01-02 HISTORY — PX: COLONOSCOPY WITH PROPOFOL: SHX5780

## 2022-01-02 HISTORY — PX: ESOPHAGOGASTRODUODENOSCOPY (EGD) WITH PROPOFOL: SHX5813

## 2022-01-02 LAB — GLUCOSE, CAPILLARY: Glucose-Capillary: 103 mg/dL — ABNORMAL HIGH (ref 70–99)

## 2022-01-02 SURGERY — COLONOSCOPY WITH PROPOFOL
Anesthesia: General

## 2022-01-02 MED ORDER — LIDOCAINE HCL (CARDIAC) PF 100 MG/5ML IV SOSY
PREFILLED_SYRINGE | INTRAVENOUS | Status: DC | PRN
  Administered 2022-01-02: 50 mg via INTRAVENOUS

## 2022-01-02 MED ORDER — SODIUM CHLORIDE 0.9 % IV SOLN
INTRAVENOUS | Status: DC
Start: 2022-01-02 — End: 2022-01-02
  Administered 2022-01-02: 20 mL/h via INTRAVENOUS

## 2022-01-02 MED ORDER — PROPOFOL 1000 MG/100ML IV EMUL
INTRAVENOUS | Status: AC
  Filled 2022-01-02: qty 100

## 2022-01-02 MED ORDER — PROPOFOL 500 MG/50ML IV EMUL
INTRAVENOUS | Status: DC | PRN
  Administered 2022-01-02: 120 ug/kg/min via INTRAVENOUS

## 2022-01-02 MED ORDER — LIDOCAINE HCL (PF) 2 % IJ SOLN
INTRAMUSCULAR | Status: AC
  Filled 2022-01-02: qty 5

## 2022-01-02 NOTE — Anesthesia Postprocedure Evaluation (Signed)
Anesthesia Post Note  Patient: Thomas Monroe  Procedure(s) Performed: COLONOSCOPY WITH PROPOFOL ESOPHAGOGASTRODUODENOSCOPY (EGD) WITH PROPOFOL  Patient location during evaluation: Endoscopy Anesthesia Type: General Level of consciousness: awake and alert Pain management: pain level controlled Vital Signs Assessment: post-procedure vital signs reviewed and stable Respiratory status: spontaneous breathing, nonlabored ventilation, respiratory function stable and patient connected to nasal cannula oxygen Cardiovascular status: blood pressure returned to baseline and stable Postop Assessment: no apparent nausea or vomiting Anesthetic complications: no   No notable events documented.   Last Vitals:  Vitals:   01/02/22 1012 01/02/22 1022  BP: 119/72 127/71  Pulse: 60 60  Resp: 15 (!) 21  Temp:    SpO2: 100% 99%    Last Pain:  Vitals:   01/02/22 1022  TempSrc:   PainSc: 0-No pain                 Precious Haws Lyndsey Demos

## 2022-01-02 NOTE — Op Note (Signed)
[facility] Gastroenterology Patient Name: Thomas Monroe Procedure Date: 01/02/2022 9:25 AM MRN: [id number] Account #: [id number] Date of Birth: May 21, 1946 Admit Type: Outpatient Age: 76 Room: [facility] ENDO ROOM 3 Gender: Male Note Status: Finalized Instrument Name: Upper Endoscope [id number] Procedure:             Upper GI endoscopy Indications:           Celiac disease Providers:             Andrey Farmer MD, MD Referring MD:          [name], MD (Referring MD) Medicines:             Monitored Anesthesia Care Complications:         No immediate complications. Estimated blood loss:                         Minimal. Procedure:             Pre-Anesthesia Assessment:                        - Prior to the procedure, a History and Physical was                         performed, and patient medications and allergies were                         reviewed. The patient is competent. The risks and                         benefits of the procedure and the sedation options and                         risks were discussed with the patient. All questions                         were answered and informed consent was obtained.                         Patient identification and proposed procedure were                         verified by the physician, the nurse, the                         anesthesiologist, the anesthetist and the technician                         in the endoscopy suite. Mental Status Examination:                         alert and oriented. Airway Examination: normal                         oropharyngeal airway and neck mobility. Respiratory                         Examination: clear to auscultation. CV Examination:  normal. Prophylactic Antibiotics: The patient does not                         require prophylactic antibiotics. Prior                         Anticoagulants: The patient has taken Plavix                          (clopidogrel), last dose was 1 week prior to                         procedure. ASA Grade Assessment: III - A patient with                         severe systemic disease. After reviewing the risks and                         benefits, the patient was deemed in satisfactory                         condition to undergo the procedure. The anesthesia                         plan was to use monitored anesthesia care (MAC).                         Immediately prior to administration of medications,                         the patient was re-assessed for adequacy to receive                         sedatives. The heart rate, respiratory rate, oxygen                         saturations, blood pressure, adequacy of pulmonary                         ventilation, and response to care were monitored                         throughout the procedure. The physical status of the                         patient was re-assessed after the procedure.                        After obtaining informed consent, the endoscope was                         passed under direct vision. Throughout the procedure,                         the patient's blood pressure, pulse, and oxygen                         saturations were monitored continuously. The Endoscope  was introduced through the mouth, and advanced to the                         second part of duodenum. The upper GI endoscopy was                         accomplished without difficulty. The patient tolerated                         the procedure well. Findings:      The examined esophagus was normal.      The entire examined stomach was normal.      There was a small lipoma, 10 mm in diameter, in the second portion of       the duodenum.      Diffuse mildly scalloped mucosa was found in the entire examined       duodenum. Biopsies were taken with a cold forceps for histology.       Estimated blood loss was minimal. Impression:             - Normal esophagus.                        - Normal stomach.                        - Duodenal lipoma.                        - Scalloped mucosa was found in the duodenum,                         diagnostic of celiac disease. Biopsied. Recommendation:        - Await pathology results.                        - Perform a colonoscopy today. Procedure Code(s):     --- Professional ---                        [phone], Esophagogastroduodenoscopy, flexible,                         transoral; with biopsy, single or multiple Diagnosis Code(s):     --- Professional ---                        D17.5, Benign lipomatous neoplasm of intra-abdominal                         organs                        K90.0, Celiac disease CPT copyright 2019 American Medical Association. All rights reserved. The codes documented in this report are preliminary and upon coder review may  be revised to meet current compliance requirements. Andrey Farmer MD, MD 01/02/2022 10:08:11 AM Number of Addenda: 0 Note Initiated On: 01/02/2022 9:25 AM Estimated Blood Loss:  Estimated blood loss was minimal.      [facility]

## 2022-01-02 NOTE — Anesthesia Preprocedure Evaluation (Signed)
Anesthesia Evaluation  Patient identified by MRN, date of birth, ID band Patient awake    Reviewed: Allergy & Precautions, NPO status , Patient's Chart, lab work & pertinent test results  History of Anesthesia Complications Negative for: history of anesthetic complications  Airway Mallampati: III  TM Distance: <3 FB Neck ROM: full    Dental  (+) Chipped, Poor Dentition, Missing   Pulmonary neg shortness of breath, former smoker,    Pulmonary exam normal        Cardiovascular Exercise Tolerance: Good hypertension, + CAD and + Past MI  Normal cardiovascular exam+ dysrhythmias + pacemaker      Neuro/Psych  Neuromuscular disease negative psych ROS   GI/Hepatic Neg liver ROS, hiatal hernia, GERD  Controlled,  Endo/Other  diabetes, Type 2  Renal/GU negative Renal ROS  negative genitourinary   Musculoskeletal   Abdominal   Peds  Hematology negative hematology ROS (+)   Anesthesia Other Findings Past Medical History: No date: BPH (benign prostatic hyperplasia) No date: Cancer ([place]) 02/01/04: Delayed gastric emptying     Comment:  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 No date: DM (diabetes mellitus) ([place])     Comment:  resolved No date: GERD (gastroesophageal reflux disease) No date: Gouty arthritis No date: Heart attack ([place]) No date: Hiatal hernia No date: History of kidney stones No date: HTN (hypertension) No date: Hypercholesterolemia  Past Surgical History: 07/07/04: COLONOSCOPY     Comment:  normal/left-side diverticula 03/09/2020: COLONOSCOPY WITH PROPOFOL; N/A     Comment:  Procedure: COLONOSCOPY WITH PROPOFOL;  Surgeon:               Lesly Rubenstein, MD;  Location: [facility] ENDOSCOPY;                Service: Endoscopy;  Laterality: N/A; 07/07/04: ESOPHAGOGASTRODUODENOSCOPY     Comment:  normal/small HH/otherwise normal 03/09/2020: ESOPHAGOGASTRODUODENOSCOPY (EGD) WITH PROPOFOL; N/A     Comment:   Procedure: ESOPHAGOGASTRODUODENOSCOPY (EGD) WITH               PROPOFOL;  Surgeon: Lesly Rubenstein, MD;  Location:               [facility] ENDOSCOPY;  Service: Endoscopy;  Laterality: N/A; No date: KIDNEY STONE SURGERY 01/19/2021: PACEMAKER IMPLANT; N/A     Comment:  Procedure: PACEMAKER IMPLANT;  Surgeon: Vickie Epley, MD;  Location: [place] [facility];  Service:               Cardiovascular;  Laterality: N/A; 2013: STAPLE HEMORRHOIDECTOMY     Comment:  Dr Jamal Collin  BMI    Body Mass Index: 24.41 kg/m      Reproductive/Obstetrics negative OB ROS                             Anesthesia Physical Anesthesia Plan  ASA: 4  Anesthesia Plan: General   Post-op Pain Management:    Induction: Intravenous  PONV Risk Score and Plan: Propofol infusion and TIVA  Airway Management Planned: Natural Airway and Nasal Cannula  Additional Equipment:   Intra-op Plan:   Post-operative Plan:   Informed Consent: I have reviewed the patients History and Physical, chart, labs and discussed the procedure including the risks, benefits and alternatives for the proposed anesthesia with the patient or authorized representative who has indicated his/her understanding and acceptance.  Dental Advisory Given  Plan Discussed with: Anesthesiologist, CRNA and Surgeon  Anesthesia Plan Comments: (Patient consented for risks of anesthesia including but not limited to:  - adverse reactions to medications - risk of airway placement if required - damage to eyes, teeth, lips or other oral mucosa - nerve damage due to positioning  - sore throat or hoarseness - Damage to heart, brain, nerves, lungs, other parts of body or loss of life  Patient voiced understanding.)        Anesthesia Quick Evaluation

## 2022-01-02 NOTE — Anesthesia Procedure Notes (Signed)
Date/Time: 01/02/2022 9:33 AM  Performed by: Donalda Ewings, CindyPre-anesthesia Checklist: Patient identified, Emergency Drugs available, Suction available, Patient being monitored and Timeout performed Patient Re-evaluated:Patient Re-evaluated prior to induction Oxygen Delivery Method: Simple face mask Preoxygenation: Pre-oxygenation with 100% oxygen Induction Type: IV induction Airway Equipment and Method: Bite block Placement Confirmation: positive ETCO2 and CO2 detector

## 2022-01-02 NOTE — Transfer of Care (Signed)
Immediate Anesthesia Transfer of Care Note  Patient: Thomas Monroe  Procedure(s) Performed: COLONOSCOPY WITH PROPOFOL ESOPHAGOGASTRODUODENOSCOPY (EGD) WITH PROPOFOL  Patient Location: PACU  Anesthesia Type:General  Level of Consciousness: awake and sedated  Airway & Oxygen Therapy: Patient Spontanous Breathing, Patient connected to nasal cannula oxygen and Patient connected to face mask oxygen  Post-op Assessment: Report given to RN and Post -op Vital signs reviewed and stable  Post vital signs: Reviewed and stable  Last Vitals:  Vitals Value Taken Time  BP    Temp    Pulse    Resp    SpO2      Last Pain:  Vitals:   01/02/22 0855  TempSrc: Temporal  PainSc: 0-No pain         Complications: No notable events documented.

## 2022-01-02 NOTE — Interval H&P Note (Signed)
History and Physical Interval Note:  01/02/2022 9:17 AM  Thomas Monroe  has presented today for surgery, with the diagnosis of Celiac Disease Colon cancer screening.  The various methods of treatment have been discussed with the patient and family. After consideration of risks, benefits and other options for treatment, the patient has consented to  Procedure(s): COLONOSCOPY WITH PROPOFOL (N/A) ESOPHAGOGASTRODUODENOSCOPY (EGD) WITH PROPOFOL (N/A) as a surgical intervention.  The patient's history has been reviewed, patient examined, no change in status, stable for surgery.  I have reviewed the patient's chart and labs.  Questions were answered to the patient's satisfaction.     Lesly Rubenstein  Ok to proceed with EGD/Colonoscopy

## 2022-01-02 NOTE — Op Note (Signed)
[facility] Gastroenterology Patient Name: Thomas Monroe Procedure Date: 01/02/2022 9:24 AM MRN: [id number] Account #: [id number] Date of Birth: Feb 06, 1946 Admit Type: Outpatient Age: 76 Room: [facility] ENDO ROOM 3 Gender: Male Note Status: Finalized Instrument Name: Jasper Riling [id number] Procedure:             Colonoscopy Indications:           Screening for colorectal malignant neoplasm Providers:             Andrey Farmer MD, MD Referring MD:          [name], MD (Referring MD) Medicines:             Monitored Anesthesia Care Complications:         No immediate complications. Estimated blood loss:                         Minimal. Procedure:             Pre-Anesthesia Assessment:                        - Prior to the procedure, a History and Physical was                         performed, and patient medications and allergies were                         reviewed. The patient is competent. The risks and                         benefits of the procedure and the sedation options and                         risks were discussed with the patient. All questions                         were answered and informed consent was obtained.                         Patient identification and proposed procedure were                         verified by the physician, the nurse, the                         anesthesiologist, the anesthetist and the technician                         in the endoscopy suite. Mental Status Examination:                         alert and oriented. Airway Examination: normal                         oropharyngeal airway and neck mobility. Respiratory                         Examination: clear to auscultation. CV Examination:  normal. Prophylactic Antibiotics: The patient does not                         require prophylactic antibiotics. Prior                         Anticoagulants: The patient has taken Plavix                          (clopidogrel), last dose was 1 week prior to                         procedure. ASA Grade Assessment: III - A patient with                         severe systemic disease. After reviewing the risks and                         benefits, the patient was deemed in satisfactory                         condition to undergo the procedure. The anesthesia                         plan was to use monitored anesthesia care (MAC).                         Immediately prior to administration of medications,                         the patient was re-assessed for adequacy to receive                         sedatives. The heart rate, respiratory rate, oxygen                         saturations, blood pressure, adequacy of pulmonary                         ventilation, and response to care were monitored                         throughout the procedure. The physical status of the                         patient was re-assessed after the procedure.                        After obtaining informed consent, the colonoscope was                         passed under direct vision. Throughout the procedure,                         the patient's blood pressure, pulse, and oxygen                         saturations were monitored continuously. The  Colonoscope was introduced through the anus and                         advanced to the the cecum, identified by appendiceal                         orifice and ileocecal valve. The colonoscopy was                         performed without difficulty. The patient tolerated                         the procedure well. The quality of the bowel                         preparation was adequate to identify polyps. Findings:      The perianal and digital rectal examinations were normal.      A 2 mm polyp was found in the ascending colon. The polyp was sessile.       The polyp was removed with a jumbo cold forceps. Resection and retrieval        were complete. Estimated blood loss was minimal.      A few small-mouthed diverticula were found in the sigmoid colon.      Internal hemorrhoids were found during retroflexion. The hemorrhoids       were Grade I (internal hemorrhoids that do not prolapse).      The exam was otherwise without abnormality on direct and retroflexion       views. Impression:            - One 2 mm polyp in the ascending colon, removed with                         a jumbo cold forceps. Resected and retrieved.                        - Diverticulosis in the sigmoid colon.                        - Internal hemorrhoids.                        - The examination was otherwise normal on direct and                         retroflexion views. Recommendation:        - Discharge patient to home.                        - Resume previous diet.                        - Continue present medications.                        - Await pathology results.                        - Repeat colonoscopy is not recommended due to current  age (32 years or older) for surveillance.                        - Resume Plavix (clopidogrel) at prior dose today. Procedure Code(s):     --- Professional ---                        [phone], Colonoscopy, flexible; with biopsy, single or                         multiple Diagnosis Code(s):     --- Professional ---                        Z12.11, Encounter for screening for malignant neoplasm                         of colon                        K63.5, Polyp of colon                        K64.0, First degree hemorrhoids                        K57.30, Diverticulosis of large intestine without                         perforation or abscess without bleeding CPT copyright 2019 American Medical Association. All rights reserved. The codes documented in this report are preliminary and upon coder review may  be revised to meet current compliance requirements. Andrey Farmer MD,  MD 01/02/2022 10:10:34 AM Number of Addenda: 0 Note Initiated On: 01/02/2022 9:24 AM Scope Withdrawal Time: 0 hours 10 minutes 31 seconds  Total Procedure Duration: 0 hours 16 minutes 29 seconds  Estimated Blood Loss:  Estimated blood loss was minimal.      [facility]

## 2022-01-02 NOTE — H&P (Signed)
Outpatient short stay form Pre-procedure 01/02/2022  Lesly Rubenstein, MD  Primary Physician: [name], MD  Reason for visit:  History of celiac disease and colon cancer screening  History of present illness:    76 y/o gentleman with history of celiac disease, hypertension, and CAD on plavix with last dose being over 7 days ago. No significant abdominal surgeries. No family history of GI malignancies. Last colonoscopy was in 2021 but had poor prep.    Current Facility-Administered Medications:    0.9 %  sodium chloride infusion, , Intravenous, Continuous, Warnell Rasnic, Hilton Cork, MD, Last Rate: 20 mL/hr at 01/02/22 0912, 20 mL/hr at 01/02/22 0912  Medications Prior to Admission  Medication Sig Dispense Refill Last Dose   allopurinol (ZYLOPRIM) 100 MG tablet Take 100 mg by mouth daily.   01/01/2022   aspirin 81 MG tablet Take 81 mg by mouth daily.   Past Week   atorvastatin (LIPITOR) 40 MG tablet Take 40 mg by mouth daily.   01/01/2022   budesonide-formoterol (SYMBICORT) 160-4.5 MCG/ACT inhaler Inhale 2 puffs into the lungs daily.   01/01/2022   clonazePAM (KLONOPIN) 1 MG tablet Take 1 mg by mouth as needed for anxiety.   01/01/2022   clopidogrel (PLAVIX) 75 MG tablet Take 75 mg by mouth daily.   Past Week   ergocalciferol (VITAMIN D2) 1.25 MG (50000 UT) capsule Take 50,000 Units by mouth once a week.   4/74/2595   folic acid (FOLVITE) 1 MG tablet Take 1 mg by mouth daily.    01/01/2022   hydrochlorothiazide (HYDRODIURIL) 12.5 MG tablet Take 12.5 mg by mouth daily.   01/01/2022   nitroGLYCERIN (NITROSTAT) 0.4 MG SL tablet Place 0.4 mg under the tongue every 5 (five) minutes as needed for chest pain.   01/01/2022   omeprazole (PRILOSEC) 20 MG capsule Take 20 mg by mouth daily.   01/01/2022   Tamsulosin HCl (FLOMAX) 0.4 MG CAPS Take 0.4 mg by mouth daily.    01/01/2022   hydrocortisone (ANUSOL-HC) 2.5 % rectal cream Place 1 application rectally 2 (two) times daily. 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    Physical Exam  Gen: Alert, oriented. Appears stated age.  HEENT: PERRLA. Lungs: No respiratory distress CV: RRR Abd: soft, benign, no masses Ext: No edema    Planned procedures: Proceed with EGD/colonoscopy. The patient understands the nature of the planned procedure, indications, risks, alternatives and potential complications including but not limited to bleeding, infection, perforation, damage to internal organs and possible oversedation/side effects from anesthesia. The patient agrees and gives consent to proceed.  Please refer to procedure notes for findings, recommendations and patient disposition/instructions.     Lesly Rubenstein, MD [facility] Gastroenterology

## 2022-01-03 ENCOUNTER — Encounter: Payer: Self-pay | Admitting: Gastroenterology

## 2022-01-04 LAB — SURGICAL PATHOLOGY

## 2022-01-23 ENCOUNTER — Ambulatory Visit (INDEPENDENT_AMBULATORY_CARE_PROVIDER_SITE_OTHER): Payer: Medicare HMO

## 2022-01-23 DIAGNOSIS — I442 Atrioventricular block, complete: Secondary | ICD-10-CM | POA: Diagnosis not present

## 2022-01-24 LAB — CUP PACEART REMOTE DEVICE CHECK
Battery Remaining Longevity: 115 mo
Battery Remaining Percentage: 93 %
Battery Voltage: 3.02 V
Brady Statistic AP VP Percent: 29 %
Brady Statistic AP VS Percent: 1 %
Brady Statistic AS VP Percent: 19 %
Brady Statistic AS VS Percent: 51 %
Brady Statistic RA Percent Paced: 30 %
Brady Statistic RV Percent Paced: 48 %
Date Time Interrogation Session: 20230821020014
Implantable Lead Implant Date: 20220817
Implantable Lead Implant Date: 20220817
Implantable Lead Location: 753859
Implantable Lead Location: 753860
Implantable Pulse Generator Implant Date: 20220817
Lead Channel Impedance Value: 440 Ohm
Lead Channel Impedance Value: 530 Ohm
Lead Channel Pacing Threshold Amplitude: 0.625 V
Lead Channel Pacing Threshold Amplitude: 0.875 V
Lead Channel Pacing Threshold Pulse Width: 0.5 ms
Lead Channel Pacing Threshold Pulse Width: 0.5 ms
Lead Channel Sensing Intrinsic Amplitude: 3.4 mV
Lead Channel Sensing Intrinsic Amplitude: 7.5 mV
Lead Channel Setting Pacing Amplitude: 0.875
Lead Channel Setting Pacing Amplitude: 1.875
Lead Channel Setting Pacing Pulse Width: 0.5 ms
Lead Channel Setting Sensing Sensitivity: 2 mV
Pulse Gen Model: 2272
Pulse Gen Serial Number: 3942808

## 2022-02-17 DIAGNOSIS — D519 Vitamin B12 deficiency anemia, unspecified: Secondary | ICD-10-CM | POA: Diagnosis not present

## 2022-02-17 DIAGNOSIS — E538 Deficiency of other specified B group vitamins: Secondary | ICD-10-CM | POA: Diagnosis not present

## 2022-02-17 DIAGNOSIS — F064 Anxiety disorder due to known physiological condition: Secondary | ICD-10-CM | POA: Diagnosis not present

## 2022-02-17 DIAGNOSIS — Z950 Presence of cardiac pacemaker: Secondary | ICD-10-CM | POA: Diagnosis not present

## 2022-02-17 DIAGNOSIS — Z1211 Encounter for screening for malignant neoplasm of colon: Secondary | ICD-10-CM | POA: Diagnosis not present

## 2022-02-17 DIAGNOSIS — E559 Vitamin D deficiency, unspecified: Secondary | ICD-10-CM | POA: Diagnosis not present

## 2022-02-17 DIAGNOSIS — C8330 Diffuse large B-cell lymphoma, unspecified site: Secondary | ICD-10-CM | POA: Diagnosis not present

## 2022-02-17 DIAGNOSIS — I2510 Atherosclerotic heart disease of native coronary artery without angina pectoris: Secondary | ICD-10-CM | POA: Diagnosis not present

## 2022-02-17 DIAGNOSIS — I6523 Occlusion and stenosis of bilateral carotid arteries: Secondary | ICD-10-CM | POA: Diagnosis not present

## 2022-02-17 DIAGNOSIS — E1165 Type 2 diabetes mellitus with hyperglycemia: Secondary | ICD-10-CM | POA: Diagnosis not present

## 2022-02-17 DIAGNOSIS — Z1331 Encounter for screening for depression: Secondary | ICD-10-CM | POA: Diagnosis not present

## 2022-02-17 DIAGNOSIS — D508 Other iron deficiency anemias: Secondary | ICD-10-CM | POA: Diagnosis not present

## 2022-02-17 DIAGNOSIS — I10 Essential (primary) hypertension: Secondary | ICD-10-CM | POA: Diagnosis not present

## 2022-02-17 DIAGNOSIS — Z125 Encounter for screening for malignant neoplasm of prostate: Secondary | ICD-10-CM | POA: Diagnosis not present

## 2022-02-17 DIAGNOSIS — E7800 Pure hypercholesterolemia, unspecified: Secondary | ICD-10-CM | POA: Diagnosis not present

## 2022-02-17 DIAGNOSIS — Z23 Encounter for immunization: Secondary | ICD-10-CM | POA: Diagnosis not present

## 2022-02-17 DIAGNOSIS — R69 Illness, unspecified: Secondary | ICD-10-CM | POA: Diagnosis not present

## 2022-02-17 DIAGNOSIS — I441 Atrioventricular block, second degree: Secondary | ICD-10-CM | POA: Diagnosis not present

## 2022-02-19 NOTE — Progress Notes (Signed)
Remote pacemaker transmission.   

## 2022-03-23 DIAGNOSIS — I2510 Atherosclerotic heart disease of native coronary artery without angina pectoris: Secondary | ICD-10-CM | POA: Diagnosis not present

## 2022-03-23 DIAGNOSIS — Z950 Presence of cardiac pacemaker: Secondary | ICD-10-CM | POA: Diagnosis not present

## 2022-03-23 DIAGNOSIS — Z136 Encounter for screening for cardiovascular disorders: Secondary | ICD-10-CM | POA: Diagnosis not present

## 2022-03-23 DIAGNOSIS — I6523 Occlusion and stenosis of bilateral carotid arteries: Secondary | ICD-10-CM | POA: Diagnosis not present

## 2022-03-23 DIAGNOSIS — E7800 Pure hypercholesterolemia, unspecified: Secondary | ICD-10-CM | POA: Diagnosis not present

## 2022-03-23 DIAGNOSIS — I10 Essential (primary) hypertension: Secondary | ICD-10-CM | POA: Diagnosis not present

## 2022-03-23 DIAGNOSIS — I700 Atherosclerosis of aorta: Secondary | ICD-10-CM | POA: Diagnosis not present

## 2022-03-23 DIAGNOSIS — I455 Other specified heart block: Secondary | ICD-10-CM | POA: Diagnosis not present

## 2022-03-23 DIAGNOSIS — E1140 Type 2 diabetes mellitus with diabetic neuropathy, unspecified: Secondary | ICD-10-CM | POA: Diagnosis not present

## 2022-03-23 DIAGNOSIS — R0989 Other specified symptoms and signs involving the circulatory and respiratory systems: Secondary | ICD-10-CM | POA: Diagnosis not present

## 2022-03-23 DIAGNOSIS — I441 Atrioventricular block, second degree: Secondary | ICD-10-CM | POA: Diagnosis not present

## 2022-03-23 DIAGNOSIS — C8330 Diffuse large B-cell lymphoma, unspecified site: Secondary | ICD-10-CM | POA: Diagnosis not present

## 2022-03-23 DIAGNOSIS — E1165 Type 2 diabetes mellitus with hyperglycemia: Secondary | ICD-10-CM | POA: Diagnosis not present

## 2022-03-23 DIAGNOSIS — N401 Enlarged prostate with lower urinary tract symptoms: Secondary | ICD-10-CM | POA: Diagnosis not present

## 2022-03-27 NOTE — Progress Notes (Signed)
Requested PCP notes.  

## 2022-04-04 ENCOUNTER — Ambulatory Visit: Payer: Medicare HMO | Attending: Internal Medicine | Admitting: Internal Medicine

## 2022-04-04 ENCOUNTER — Encounter: Payer: Self-pay | Admitting: Internal Medicine

## 2022-04-04 VITALS — BP 126/76 | HR 67 | Ht 72.0 in | Wt 205.0 lb

## 2022-04-04 DIAGNOSIS — Z950 Presence of cardiac pacemaker: Secondary | ICD-10-CM

## 2022-04-04 DIAGNOSIS — I442 Atrioventricular block, complete: Secondary | ICD-10-CM | POA: Diagnosis not present

## 2022-04-04 NOTE — Progress Notes (Unsigned)
Patient Care Team: [name], MD as PCP - General (Radiology) Daneil Dolin, MD (Gastroenterology)   HPI  Thomas Monroe is a 76 y.o. male seen in follow-up for pacing 8/22 (CL) Abbott for intermittent complete heart block (up to 3 nonconducted P waves in a row) associated with a paucity of symptoms  However, following pacing, he notices more energy, and less dizziness.  The patient denies chest pain, shortness of breath, nocturnal dyspnea, orthopnea or peripheral edema.  There have been no palpitations, lightheadedness or syncope.    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   DM (diabetes mellitus) ([place])    resolved   GERD (gastroesophageal reflux disease)    Gouty arthritis    Heart attack ([place])    Hiatal hernia    History of kidney stones    HTN (hypertension)    Hypercholesterolemia     Past Surgical History:  Procedure Laterality Date   COLONOSCOPY  07/07/04   normal/left-side diverticula   COLONOSCOPY WITH PROPOFOL N/A 03/09/2020   Procedure: COLONOSCOPY WITH PROPOFOL;  Surgeon: Lesly Rubenstein, MD;  Location: [facility] ENDOSCOPY;  Service: Endoscopy;  Laterality: N/A;   COLONOSCOPY WITH PROPOFOL N/A 01/02/2022   Procedure: COLONOSCOPY WITH PROPOFOL;  Surgeon: Lesly Rubenstein, MD;  Location: [facility] ENDOSCOPY;  Service: Endoscopy;  Laterality: N/A;   ESOPHAGOGASTRODUODENOSCOPY  07/07/04   normal/small HH/otherwise normal   ESOPHAGOGASTRODUODENOSCOPY (EGD) WITH PROPOFOL N/A 03/09/2020   Procedure: ESOPHAGOGASTRODUODENOSCOPY (EGD) WITH PROPOFOL;   Surgeon: Lesly Rubenstein, MD;  Location: [facility] ENDOSCOPY;  Service: Endoscopy;  Laterality: N/A;   ESOPHAGOGASTRODUODENOSCOPY (EGD) WITH PROPOFOL N/A 01/02/2022   Procedure: ESOPHAGOGASTRODUODENOSCOPY (EGD) WITH PROPOFOL;  Surgeon: Lesly Rubenstein, MD;  Location: [facility] ENDOSCOPY;  Service: Endoscopy;  Laterality: N/A;   KIDNEY STONE SURGERY     PACEMAKER IMPLANT N/A 01/19/2021   Procedure: PACEMAKER IMPLANT;  Surgeon: Vickie Epley, MD;  Location: [place] [facility];  Service: Cardiovascular;  Laterality: N/A;   STAPLE HEMORRHOIDECTOMY  2013   Dr Jamal Collin    Current Meds  Medication Sig   allopurinol (ZYLOPRIM) 100 MG tablet Take 100 mg by mouth daily.   aspirin 81 MG tablet Take 81 mg by mouth daily.   atorvastatin (LIPITOR) 40 MG tablet Take 40 mg by mouth daily.   budesonide-formoterol (SYMBICORT) 160-4.5 MCG/ACT inhaler Inhale 2 puffs into the lungs daily.   clonazePAM (KLONOPIN) 1 MG tablet Take 1 mg by mouth as needed for anxiety.   ergocalciferol (VITAMIN D2) 1.25 MG (50000 UT) capsule Take 50,000 Units by mouth once a week.   folic acid (FOLVITE) 1 MG tablet Take 1 mg by mouth daily.    hydrochlorothiazide (HYDRODIURIL) 12.5 MG tablet Take 12.5 mg by mouth daily.   nitroGLYCERIN (NITROSTAT) 0.4 MG SL tablet Place 0.4 mg under the tongue every 5 (five) minutes as  needed for chest pain.   omeprazole (PRILOSEC) 20 MG capsule Take 20 mg by mouth daily.   Tamsulosin HCl (FLOMAX) 0.4 MG CAPS Take 0.4 mg by mouth daily.     No Known Allergies    Review of Systems negative except from HPI and PMH  Physical Exam BP 126/76 (BP Location: Left Arm, Patient Position: Sitting, Cuff Size: Large)   Pulse 67   Ht 6' (1.829 m)   Wt 205 lb (93 kg)   SpO2 97%   BMI 27.80 kg/m  Well developed and well nourished in no acute distress HENT normal Neck supple with JVP-flat Clear Device pocket well healed; without hematoma or erythema.  There is no tethering  Regular rate and  rhythm, no  murmur Abd-soft with active BS No Clubbing cyanosis  edema Skin-warm and dry A & Oriented  Grossly normal sensory and motor function  ECG SINUS  @ 67 32/09/39   CrCl cannot be calculated (Patient's most recent lab result is older than the maximum 21 days allowed.).   Assessment and  Plan  Complete Heart Block intermittent   CAD-- s/p PCI  details not available   Atrial fibrillation paroxysmal  Pacemaker-Abbott  Anemia  Ventricular pacing remains about 50%.  Likely some degree of fusion Will reprogram AV delay and maintain VIP  Without symptoms of ischemia.  Continue atorvastatin, ASA 81  wll need lipids checked  Anemia is subacute but stable over the las year-- borderline FeDef  defer w/u to PCP  Device function is normal. Programming changes none  See Paceart for details

## 2022-04-04 NOTE — Patient Instructions (Signed)
Medication Instructions:  - Your physician recommends that you continue on your current medications as directed. Please refer to the Current Medication list given to you today.  *If you need a refill on your cardiac medications before your next appointment, please call your pharmacy*   Lab Work: - none ordered  If you have labs (blood work) drawn today and your tests are completely normal, you will receive your results only by: MyChart Message (if you have MyChart) OR A paper copy in the mail If you have any lab test that is abnormal or we need to change your treatment, we will call you to review the results.   Testing/Procedures: - none ordered   Follow-Up: At [facility], you and your health needs are our priority.  As part of our continuing mission to provide you with exceptional heart care, we have created designated Provider Care Teams.  These Care Teams include your primary Cardiologist (physician) and Advanced Practice Providers (APPs -  Physician Assistants and Nurse Practitioners) who all work together to provide you with the care you need, when you need it.  We recommend signing up for the patient portal called "MyChart".  Sign up information is provided on this After Visit Summary.  MyChart is used to connect with patients for Virtual Visits (Telemedicine).  Patients are able to view lab/test results, encounter notes, upcoming appointments, etc.  Non-urgent messages can be sent to your provider as well.   To learn more about what you can do with MyChart, go to [email or web address].    Your next appointment:   1 year(s)  The format for your next appointment:   In Person  Provider:   Steven Klein, MD    Other Instructions N/a  Important Information About Sugar       

## 2022-04-05 LAB — CUP PACEART INCLINIC DEVICE CHECK
Battery Remaining Longevity: 118 mo
Battery Voltage: 3.02 V
Brady Statistic RA Percent Paced: 29 %
Brady Statistic RV Percent Paced: 49 %
Date Time Interrogation Session: 20231031095600
Implantable Lead Connection Status: 753985
Implantable Lead Connection Status: 753985
Implantable Lead Implant Date: 20220817
Implantable Lead Implant Date: 20220817
Implantable Lead Location: 753859
Implantable Lead Location: 753860
Implantable Pulse Generator Implant Date: 20220817
Lead Channel Impedance Value: 462.5 Ohm
Lead Channel Impedance Value: 562.5 Ohm
Lead Channel Pacing Threshold Amplitude: 0.75 V
Lead Channel Pacing Threshold Amplitude: 0.75 V
Lead Channel Pacing Threshold Amplitude: 0.75 V
Lead Channel Pacing Threshold Amplitude: 0.75 V
Lead Channel Pacing Threshold Pulse Width: 0.5 ms
Lead Channel Pacing Threshold Pulse Width: 0.5 ms
Lead Channel Pacing Threshold Pulse Width: 0.5 ms
Lead Channel Pacing Threshold Pulse Width: 0.5 ms
Lead Channel Sensing Intrinsic Amplitude: 3.5 mV
Lead Channel Sensing Intrinsic Amplitude: 8 mV
Lead Channel Setting Pacing Amplitude: 1 V
Lead Channel Setting Pacing Amplitude: 1.75 V
Lead Channel Setting Pacing Pulse Width: 0.5 ms
Lead Channel Setting Sensing Sensitivity: 2 mV
Pulse Gen Model: 2272
Pulse Gen Serial Number: 3942808

## 2022-04-10 DIAGNOSIS — C8330 Diffuse large B-cell lymphoma, unspecified site: Secondary | ICD-10-CM | POA: Diagnosis not present

## 2022-04-10 DIAGNOSIS — I2510 Atherosclerotic heart disease of native coronary artery without angina pectoris: Secondary | ICD-10-CM | POA: Diagnosis not present

## 2022-04-10 DIAGNOSIS — E559 Vitamin D deficiency, unspecified: Secondary | ICD-10-CM | POA: Diagnosis not present

## 2022-04-10 DIAGNOSIS — D508 Other iron deficiency anemias: Secondary | ICD-10-CM | POA: Diagnosis not present

## 2022-04-10 DIAGNOSIS — E1140 Type 2 diabetes mellitus with diabetic neuropathy, unspecified: Secondary | ICD-10-CM | POA: Diagnosis not present

## 2022-04-10 DIAGNOSIS — I6523 Occlusion and stenosis of bilateral carotid arteries: Secondary | ICD-10-CM | POA: Diagnosis not present

## 2022-04-10 DIAGNOSIS — N401 Enlarged prostate with lower urinary tract symptoms: Secondary | ICD-10-CM | POA: Diagnosis not present

## 2022-04-10 DIAGNOSIS — Z950 Presence of cardiac pacemaker: Secondary | ICD-10-CM | POA: Diagnosis not present

## 2022-04-10 DIAGNOSIS — I441 Atrioventricular block, second degree: Secondary | ICD-10-CM | POA: Diagnosis not present

## 2022-04-10 DIAGNOSIS — E7800 Pure hypercholesterolemia, unspecified: Secondary | ICD-10-CM | POA: Diagnosis not present

## 2022-04-18 DIAGNOSIS — C8330 Diffuse large B-cell lymphoma, unspecified site: Secondary | ICD-10-CM | POA: Diagnosis not present

## 2022-04-18 DIAGNOSIS — I6523 Occlusion and stenosis of bilateral carotid arteries: Secondary | ICD-10-CM | POA: Diagnosis not present

## 2022-04-18 DIAGNOSIS — D508 Other iron deficiency anemias: Secondary | ICD-10-CM | POA: Diagnosis not present

## 2022-04-18 DIAGNOSIS — E1140 Type 2 diabetes mellitus with diabetic neuropathy, unspecified: Secondary | ICD-10-CM | POA: Diagnosis not present

## 2022-04-18 DIAGNOSIS — I2510 Atherosclerotic heart disease of native coronary artery without angina pectoris: Secondary | ICD-10-CM | POA: Diagnosis not present

## 2022-04-18 DIAGNOSIS — E7800 Pure hypercholesterolemia, unspecified: Secondary | ICD-10-CM | POA: Diagnosis not present

## 2022-04-18 DIAGNOSIS — Z950 Presence of cardiac pacemaker: Secondary | ICD-10-CM | POA: Diagnosis not present

## 2022-04-18 DIAGNOSIS — E559 Vitamin D deficiency, unspecified: Secondary | ICD-10-CM | POA: Diagnosis not present

## 2022-04-18 DIAGNOSIS — I455 Other specified heart block: Secondary | ICD-10-CM | POA: Diagnosis not present

## 2022-04-18 DIAGNOSIS — M818 Other osteoporosis without current pathological fracture: Secondary | ICD-10-CM | POA: Diagnosis not present

## 2022-04-18 DIAGNOSIS — E538 Deficiency of other specified B group vitamins: Secondary | ICD-10-CM | POA: Diagnosis not present

## 2022-04-24 ENCOUNTER — Ambulatory Visit (INDEPENDENT_AMBULATORY_CARE_PROVIDER_SITE_OTHER): Payer: Medicare HMO

## 2022-04-24 DIAGNOSIS — I442 Atrioventricular block, complete: Secondary | ICD-10-CM

## 2022-04-25 LAB — CUP PACEART REMOTE DEVICE CHECK
Battery Remaining Longevity: 112 mo
Battery Remaining Longevity: 113 mo
Battery Remaining Percentage: 90 %
Battery Remaining Percentage: 90 %
Battery Voltage: 3.02 V
Battery Voltage: 3.02 V
Brady Statistic AP VP Percent: 30 %
Brady Statistic AP VP Percent: 30 %
Brady Statistic AP VS Percent: 1 %
Brady Statistic AP VS Percent: 1 %
Brady Statistic AS VP Percent: 36 %
Brady Statistic AS VP Percent: 36 %
Brady Statistic AS VS Percent: 34 %
Brady Statistic AS VS Percent: 34 %
Brady Statistic RA Percent Paced: 30 %
Brady Statistic RA Percent Paced: 30 %
Brady Statistic RV Percent Paced: 66 %
Brady Statistic RV Percent Paced: 66 %
Date Time Interrogation Session: 20231120020022
Date Time Interrogation Session: 20231120060748
Implantable Lead Connection Status: 753985
Implantable Lead Connection Status: 753985
Implantable Lead Connection Status: 753985
Implantable Lead Connection Status: 753985
Implantable Lead Implant Date: 20220817
Implantable Lead Implant Date: 20220817
Implantable Lead Implant Date: 20220817
Implantable Lead Implant Date: 20220817
Implantable Lead Location: 753859
Implantable Lead Location: 753860
Implantable Lead Location: 753859
Implantable Lead Location: 753860
Implantable Pulse Generator Implant Date: 20220817
Implantable Pulse Generator Implant Date: 20220817
Lead Channel Impedance Value: 440 Ohm
Lead Channel Impedance Value: 540 Ohm
Lead Channel Impedance Value: 440 Ohm
Lead Channel Impedance Value: 540 Ohm
Lead Channel Pacing Threshold Amplitude: 0.625 V
Lead Channel Pacing Threshold Amplitude: 0.875 V
Lead Channel Pacing Threshold Amplitude: 0.625 V
Lead Channel Pacing Threshold Amplitude: 0.75 V
Lead Channel Pacing Threshold Pulse Width: 0.5 ms
Lead Channel Pacing Threshold Pulse Width: 0.5 ms
Lead Channel Pacing Threshold Pulse Width: 0.5 ms
Lead Channel Pacing Threshold Pulse Width: 0.5 ms
Lead Channel Sensing Intrinsic Amplitude: 2.6 mV
Lead Channel Sensing Intrinsic Amplitude: 7.7 mV
Lead Channel Sensing Intrinsic Amplitude: 2.6 mV
Lead Channel Sensing Intrinsic Amplitude: 7.7 mV
Lead Channel Setting Pacing Amplitude: 0.875
Lead Channel Setting Pacing Amplitude: 1.875
Lead Channel Setting Pacing Amplitude: 0.875
Lead Channel Setting Pacing Amplitude: 1.75 V
Lead Channel Setting Pacing Pulse Width: 0.5 ms
Lead Channel Setting Pacing Pulse Width: 0.5 ms
Lead Channel Setting Sensing Sensitivity: 2 mV
Lead Channel Setting Sensing Sensitivity: 2 mV
Pulse Gen Model: 2272
Pulse Gen Model: 2272
Pulse Gen Serial Number: 3942808
Pulse Gen Serial Number: 3942808

## 2022-04-26 DIAGNOSIS — K900 Celiac disease: Secondary | ICD-10-CM | POA: Diagnosis not present

## 2022-05-12 DIAGNOSIS — D508 Other iron deficiency anemias: Secondary | ICD-10-CM | POA: Diagnosis not present

## 2022-05-12 DIAGNOSIS — E7800 Pure hypercholesterolemia, unspecified: Secondary | ICD-10-CM | POA: Diagnosis not present

## 2022-05-12 DIAGNOSIS — I10 Essential (primary) hypertension: Secondary | ICD-10-CM | POA: Diagnosis not present

## 2022-05-12 DIAGNOSIS — I455 Other specified heart block: Secondary | ICD-10-CM | POA: Diagnosis not present

## 2022-05-12 DIAGNOSIS — N401 Enlarged prostate with lower urinary tract symptoms: Secondary | ICD-10-CM | POA: Diagnosis not present

## 2022-05-12 DIAGNOSIS — E1140 Type 2 diabetes mellitus with diabetic neuropathy, unspecified: Secondary | ICD-10-CM | POA: Diagnosis not present

## 2022-05-12 DIAGNOSIS — C8330 Diffuse large B-cell lymphoma, unspecified site: Secondary | ICD-10-CM | POA: Diagnosis not present

## 2022-05-12 DIAGNOSIS — I2510 Atherosclerotic heart disease of native coronary artery without angina pectoris: Secondary | ICD-10-CM | POA: Diagnosis not present

## 2022-05-12 DIAGNOSIS — E559 Vitamin D deficiency, unspecified: Secondary | ICD-10-CM | POA: Diagnosis not present

## 2022-05-12 DIAGNOSIS — E538 Deficiency of other specified B group vitamins: Secondary | ICD-10-CM | POA: Diagnosis not present

## 2022-05-12 DIAGNOSIS — I441 Atrioventricular block, second degree: Secondary | ICD-10-CM | POA: Diagnosis not present

## 2022-06-07 NOTE — Progress Notes (Signed)
Remote pacemaker transmission.   

## 2022-07-03 ENCOUNTER — Ambulatory Visit: Payer: Medicare HMO | Admitting: Dietician

## 2022-07-24 ENCOUNTER — Ambulatory Visit (INDEPENDENT_AMBULATORY_CARE_PROVIDER_SITE_OTHER): Payer: Medicare HMO

## 2022-07-24 DIAGNOSIS — I442 Atrioventricular block, complete: Secondary | ICD-10-CM | POA: Diagnosis not present

## 2022-07-24 LAB — CUP PACEART REMOTE DEVICE CHECK
Battery Remaining Longevity: 108 mo
Battery Remaining Percentage: 88 %
Battery Voltage: 3.02 V
Brady Statistic AP VP Percent: 29 %
Brady Statistic AP VS Percent: 1 %
Brady Statistic AS VP Percent: 33 %
Brady Statistic AS VS Percent: 38 %
Brady Statistic RA Percent Paced: 29 %
Brady Statistic RV Percent Paced: 62 %
Date Time Interrogation Session: 20240219020014
Implantable Lead Connection Status: 753985
Implantable Lead Connection Status: 753985
Implantable Lead Implant Date: 20220817
Implantable Lead Implant Date: 20220817
Implantable Lead Location: 753859
Implantable Lead Location: 753860
Implantable Pulse Generator Implant Date: 20220817
Lead Channel Impedance Value: 410 Ohm
Lead Channel Impedance Value: 530 Ohm
Lead Channel Pacing Threshold Amplitude: 0.625 V
Lead Channel Pacing Threshold Amplitude: 0.75 V
Lead Channel Pacing Threshold Pulse Width: 0.5 ms
Lead Channel Pacing Threshold Pulse Width: 0.5 ms
Lead Channel Sensing Intrinsic Amplitude: 12 mV
Lead Channel Sensing Intrinsic Amplitude: 3.1 mV
Lead Channel Setting Pacing Amplitude: 0.875
Lead Channel Setting Pacing Amplitude: 1.75 V
Lead Channel Setting Pacing Pulse Width: 0.5 ms
Lead Channel Setting Sensing Sensitivity: 2 mV
Pulse Gen Model: 2272
Pulse Gen Serial Number: 3942808

## 2022-09-06 NOTE — Progress Notes (Signed)
Remote pacemaker transmission.   

## 2022-10-23 ENCOUNTER — Ambulatory Visit (INDEPENDENT_AMBULATORY_CARE_PROVIDER_SITE_OTHER): Payer: Medicare HMO

## 2022-10-23 DIAGNOSIS — I442 Atrioventricular block, complete: Secondary | ICD-10-CM

## 2022-10-23 LAB — CUP PACEART REMOTE DEVICE CHECK
Battery Remaining Longevity: 106 mo
Battery Remaining Percentage: 86 %
Battery Voltage: 3.01 V
Brady Statistic AP VP Percent: 26 %
Brady Statistic AP VS Percent: 1 %
Brady Statistic AS VP Percent: 32 %
Brady Statistic AS VS Percent: 41 %
Brady Statistic RA Percent Paced: 26 %
Brady Statistic RV Percent Paced: 58 %
Date Time Interrogation Session: 20240520020013
Implantable Lead Connection Status: 753985
Implantable Lead Connection Status: 753985
Implantable Lead Implant Date: 20220817
Implantable Lead Implant Date: 20220817
Implantable Lead Location: 753859
Implantable Lead Location: 753860
Implantable Pulse Generator Implant Date: 20220817
Lead Channel Impedance Value: 430 Ohm
Lead Channel Impedance Value: 530 Ohm
Lead Channel Pacing Threshold Amplitude: 0.75 V
Lead Channel Pacing Threshold Amplitude: 0.75 V
Lead Channel Pacing Threshold Pulse Width: 0.5 ms
Lead Channel Pacing Threshold Pulse Width: 0.5 ms
Lead Channel Sensing Intrinsic Amplitude: 10.5 mV
Lead Channel Sensing Intrinsic Amplitude: 3.9 mV
Lead Channel Setting Pacing Amplitude: 1 V
Lead Channel Setting Pacing Amplitude: 1.75 V
Lead Channel Setting Pacing Pulse Width: 0.5 ms
Lead Channel Setting Sensing Sensitivity: 2 mV
Pulse Gen Model: 2272
Pulse Gen Serial Number: 3942808

## 2022-11-20 NOTE — Progress Notes (Signed)
Remote pacemaker transmission.   

## 2023-01-22 ENCOUNTER — Ambulatory Visit (INDEPENDENT_AMBULATORY_CARE_PROVIDER_SITE_OTHER): Payer: Medicare HMO

## 2023-01-22 DIAGNOSIS — I442 Atrioventricular block, complete: Secondary | ICD-10-CM | POA: Diagnosis not present

## 2023-01-22 LAB — CUP PACEART REMOTE DEVICE CHECK
Battery Remaining Longevity: 103 mo
Battery Remaining Percentage: 84 %
Battery Voltage: 3.01 V
Brady Statistic AP VP Percent: 30 %
Brady Statistic AP VS Percent: 1 %
Brady Statistic AS VP Percent: 30 %
Brady Statistic AS VS Percent: 40 %
Brady Statistic RA Percent Paced: 30 %
Brady Statistic RV Percent Paced: 60 %
Date Time Interrogation Session: 20240819020015
Implantable Lead Connection Status: 753985
Implantable Lead Connection Status: 753985
Implantable Lead Implant Date: 20220817
Implantable Lead Implant Date: 20220817
Implantable Lead Location: 753859
Implantable Lead Location: 753860
Implantable Pulse Generator Implant Date: 20220817
Lead Channel Impedance Value: 480 Ohm
Lead Channel Impedance Value: 540 Ohm
Lead Channel Pacing Threshold Amplitude: 0.75 V
Lead Channel Pacing Threshold Amplitude: 0.875 V
Lead Channel Pacing Threshold Pulse Width: 0.5 ms
Lead Channel Pacing Threshold Pulse Width: 0.5 ms
Lead Channel Sensing Intrinsic Amplitude: 10.4 mV
Lead Channel Sensing Intrinsic Amplitude: 5 mV
Lead Channel Setting Pacing Amplitude: 1.125
Lead Channel Setting Pacing Amplitude: 1.75 V
Lead Channel Setting Pacing Pulse Width: 0.5 ms
Lead Channel Setting Sensing Sensitivity: 2 mV
Pulse Gen Model: 2272
Pulse Gen Serial Number: 3942808

## 2023-02-01 NOTE — Progress Notes (Signed)
Remote pacemaker transmission.   

## 2023-04-16 NOTE — Progress Notes (Unsigned)
Cardiology Office Note Date:  04/17/2023  Patient ID:  Thomas Monroe, Thomas Monroe 1945/12/01, MRN [id number] PCP:  Alan Mulder, MD  Cardiologist:  None Electrophysiologist: Sherryl Manges, MD    Chief Complaint: 1 year device follow-up  History of Present Illness: Thomas Monroe is a 77 y.o. male with PMH notable for CHB s/p PPM, parox AFib (SCAF), CAD s/p PCI, HTN, T2DM; seen today for Sherryl Manges, MD for routine electrophysiology followup.  He last saw Dr. Graciela Husbands 03/2022, doing well having SCAF so not on OAC.  On follow-up today, he is overall doing very well. Continues to work in Producer, television/film/video. He endorses bendopnea, and some lightheadedness with quick position changes. No syncope, symptoms resolve quickly. No chest pain, pressure, SOB correlate with these episodes.  He does not check BP regularly at home. Has a cuff and does check when he is feeling "off", but does not recall measurements.   Currently, he denies chest pain, palpitations, dyspnea, PND, orthopnea, nausea, vomiting, dizziness, syncope, edema, weight gain, or early satiety.     Device Information: St. 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/04   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   DM (diabetes mellitus) (HCC)    resolved   GERD (gastroesophageal reflux disease)    Gouty arthritis    Heart attack (HCC)    Hiatal hernia    History of kidney stones    HTN (hypertension)    Hypercholesterolemia     Past Surgical History:  Procedure Laterality Date   COLONOSCOPY  07/07/04   normal/left-side diverticula   COLONOSCOPY WITH PROPOFOL N/A 03/09/2020   Procedure: COLONOSCOPY WITH PROPOFOL;  Surgeon: Regis Bill, MD;  Location: [facility] ENDOSCOPY;  Service: Endoscopy;  Laterality: N/A;   COLONOSCOPY WITH PROPOFOL N/A 01/02/2022   Procedure: COLONOSCOPY WITH PROPOFOL;  Surgeon:  Regis Bill, MD;  Location: [facility] ENDOSCOPY;  Service: Endoscopy;  Laterality: N/A;   ESOPHAGOGASTRODUODENOSCOPY  07/07/04   normal/small HH/otherwise normal   ESOPHAGOGASTRODUODENOSCOPY (EGD) WITH PROPOFOL N/A 03/09/2020   Procedure: ESOPHAGOGASTRODUODENOSCOPY (EGD) WITH PROPOFOL;  Surgeon: Regis Bill, MD;  Location: [facility] ENDOSCOPY;  Service: Endoscopy;  Laterality: N/A;   ESOPHAGOGASTRODUODENOSCOPY (EGD) WITH PROPOFOL N/A 01/02/2022   Procedure: ESOPHAGOGASTRODUODENOSCOPY (EGD) WITH PROPOFOL;  Surgeon: Regis Bill, MD;  Location: [facility] ENDOSCOPY;  Service: Endoscopy;  Laterality: N/A;   KIDNEY STONE SURGERY     PACEMAKER IMPLANT N/A 01/19/2021   Procedure: PACEMAKER IMPLANT;  Surgeon: Lanier Prude, MD;  Location: [facility];  Service: Cardiovascular;  Laterality: N/A;   STAPLE HEMORRHOIDECTOMY  2013   Dr Evette Cristal    Current Outpatient Medications  Medication Instructions   allopurinol (ZYLOPRIM) 100 mg, Oral, Daily   aspirin 81 mg, Daily   atorvastatin (LIPITOR) 40 mg, Oral, Daily   budesonide-formoterol (SYMBICORT) 160-4.5 MCG/ACT inhaler 2 puffs, Inhalation, Daily   clonazePAM (KLONOPIN) 1 mg, Oral, As needed   ergocalciferol (VITAMIN D2) 50,000 Units, Oral, Weekly   folic acid (FOLVITE) 1 mg, Daily   nitroGLYCERIN (NITROSTAT) 0.4 mg, Sublingual, Every 5 min PRN   omeprazole (PRILOSEC) 20 mg, Oral, Daily   tamsulosin (FLOMAX) 0.4 mg, Daily    Social History:  The patient  reports that he has quit smoking. He has never used smokeless tobacco. He reports that he does not drink alcohol and does not use  drugs.   Family History:  The patient's family history includes Alcohol abuse in his brother and brother; Diabetes in his mother; Heart disease in his mother.  ROS:  Please see the history of present illness. All other systems are reviewed and otherwise negative.   PHYSICAL EXAM:  VS:  BP 128/70 (BP Location: Left Arm, Patient Position: Sitting, Cuff  Size: Normal)   Pulse 65   Ht 6\' 1"  (1.854 m)   Wt 195 lb 8 oz (88.7 kg)   SpO2 98%   BMI 25.79 kg/m  BMI: Body mass index is 25.79 kg/m.  GEN- The patient is well appearing, alert and oriented x 3 today.   Lungs- Clear to ausculation bilaterally, normal work of breathing.  Heart- Regular rate and rhythm, no murmurs, rubs or gallops Extremities- No peripheral edema, warm, dry Skin-  device pocket well-healed, no tethering   Device interrogation done today and reviewed by myself:  Battery 8 years Lead thresholds, impedence, sensing stable  63% VP PMT episode SCAF episodes, all less than 1 minute No changes made today   EKG is ordered. Personal review of EKG from today shows:    EKG Interpretation Date/Time:  Tuesday April 17 2023 09:04:31 EST Ventricular Rate:  65 PR Interval:  198 QRS Duration:  180 QT Interval:  480 QTC Calculation: 499 R Axis:   -75  Text Interpretation: AV dual-paced rhythm with Premature atrial complexes with Abberant conduction Confirmed by Sherie Don [phone]) on 04/17/2023 9:10:21 AM    Recent Labs: No results found for requested labs within last 365 days.  No results found for requested labs within last 365 days.   CrCl cannot be calculated (Patient's most recent lab result is older than the maximum 21 days allowed.).   Wt Readings from Last 3 Encounters:  04/17/23 195 lb 8 oz (88.7 kg)  04/04/22 205 lb (93 kg)  01/02/22 180 lb (81.6 kg)     Additional studies reviewed include: Previous EP, cardiology notes.   TTE, 05/12/2016 - Procedure narrative: Transthoracic echocardiography. The study was technically difficult.  - Left ventricle: Systolic function was normal. The estimated ejection fraction was in the range of 55% to 60%. Doppler parameters are consistent with abnormal left ventricular relaxation (grade 1 diastolic dysfunction).  - Aortic valve: There was trivial regurgitation. Valve area (Vmax): 2.83 cm^2.  - Mitral valve:  There was mild regurgitation.          ASSESSMENT AND PLAN:  #) CHB s/p PPM  Device functioning well, see paceart for details Continues to have SCAF episodes, no indication for OAC at this time Update TTE with relatively high VP from RV lead  #) HTN Well-controlled in office today but patient having orthostatic symptoms with position changes and bending over.  Stop hydrochlorothiazide Recommended he check BP 2-3 times per week and record measurements. Bring BP log to PCP appt (scheduled for 05/2023)  #) CAD No ischemic symptoms with activity or rest       Current medicines are reviewed at length with the patient today.   The patient does not have concerns regarding his medicines.  The following changes were made today:   STOP 12.5mg  hctz  Labs/ tests ordered today include:  Orders Placed This Encounter  Procedures   EKG 12-Lead   ECHOCARDIOGRAM COMPLETE     Disposition: Follow up with Dr. Graciela Husbands or EP APP in 12 months   Signed, Sherie Don, NP  04/17/23  9:37 AM  Electrophysiology [facility]

## 2023-04-17 ENCOUNTER — Ambulatory Visit: Payer: Medicare HMO | Attending: Cardiology | Admitting: Cardiology

## 2023-04-17 ENCOUNTER — Encounter: Payer: Self-pay | Admitting: Cardiology

## 2023-04-17 VITALS — BP 128/70 | HR 65 | Ht 73.0 in | Wt 195.5 lb

## 2023-04-17 DIAGNOSIS — Z950 Presence of cardiac pacemaker: Secondary | ICD-10-CM | POA: Diagnosis not present

## 2023-04-17 DIAGNOSIS — I2510 Atherosclerotic heart disease of native coronary artery without angina pectoris: Secondary | ICD-10-CM | POA: Diagnosis not present

## 2023-04-17 DIAGNOSIS — I442 Atrioventricular block, complete: Secondary | ICD-10-CM | POA: Diagnosis not present

## 2023-04-17 DIAGNOSIS — I10 Essential (primary) hypertension: Secondary | ICD-10-CM | POA: Diagnosis not present

## 2023-04-17 LAB — CUP PACEART INCLINIC DEVICE CHECK
Battery Remaining Longevity: 102 mo
Battery Voltage: 3.01 V
Brady Statistic RA Percent Paced: 33 %
Brady Statistic RV Percent Paced: 63 %
Date Time Interrogation Session: 20241112094659
Implantable Lead Connection Status: 753985
Implantable Lead Connection Status: 753985
Implantable Lead Implant Date: 20220817
Implantable Lead Implant Date: 20220817
Implantable Lead Location: 753859
Implantable Lead Location: 753860
Implantable Pulse Generator Implant Date: 20220817
Lead Channel Impedance Value: 437.5 Ohm
Lead Channel Impedance Value: 550 Ohm
Lead Channel Pacing Threshold Amplitude: 0.75 V
Lead Channel Pacing Threshold Amplitude: 0.75 V
Lead Channel Pacing Threshold Amplitude: 1 V
Lead Channel Pacing Threshold Amplitude: 1 V
Lead Channel Pacing Threshold Pulse Width: 0.5 ms
Lead Channel Pacing Threshold Pulse Width: 0.5 ms
Lead Channel Pacing Threshold Pulse Width: 0.5 ms
Lead Channel Pacing Threshold Pulse Width: 0.5 ms
Lead Channel Sensing Intrinsic Amplitude: 10.1 mV
Lead Channel Sensing Intrinsic Amplitude: 5 mV
Lead Channel Setting Pacing Amplitude: 1.125
Lead Channel Setting Pacing Amplitude: 1.75 V
Lead Channel Setting Pacing Pulse Width: 0.5 ms
Lead Channel Setting Sensing Sensitivity: 2 mV
Pulse Gen Model: 2272
Pulse Gen Serial Number: 3942808

## 2023-04-17 NOTE — Patient Instructions (Signed)
Medication Instructions:  Your physician has recommended you make the following change in your medication:  STOP Hydrochlorothiazide.  *If you need a refill on your cardiac medications before your next appointment, please call your pharmacy*   Lab Work:  NONE  If you have labs (blood work) drawn today and your tests are completely normal, you will receive your results only by: MyChart Message (if you have MyChart) OR A paper copy in the mail If you have any lab test that is abnormal or we need to change your treatment, we will call you to review the results.   Testing/Procedures: Your physician has requested that you have an echocardiogram. Echocardiography is a painless test that uses sound waves to create images of your heart. It provides your doctor with information about the size and shape of your heart and how well your heart's chambers and valves are working. This procedure takes approximately one hour. There are no restrictions for this procedure. Please do NOT wear cologne, perfume, aftershave, or lotions (deodorant is allowed). Please arrive 15 minutes prior to your appointment time.  Please note: We ask at that you not bring children with you during ultrasound (echo/ vascular) testing. Due to room size and safety concerns, children are not allowed in the ultrasound rooms during exams. Our front office staff cannot provide observation of children in our lobby area while testing is being conducted. An adult accompanying a patient to their appointment will only be allowed in the ultrasound room at the discretion of the ultrasound technician under special circumstances. We apologize for any inconvenience.    Follow-Up: At [facility], you and your health needs are our priority.  As part of our continuing mission to provide you with exceptional heart care, we have created designated Provider Care Teams.  These Care Teams include your primary Cardiologist (physician) and  Advanced Practice Providers (APPs -  Physician Assistants and Nurse Practitioners) who all work together to provide you with the care you need, when you need it.  We recommend signing up for the patient portal called "MyChart".  Sign up information is provided on this After Visit Summary.  MyChart is used to connect with patients for Virtual Visits (Telemedicine).  Patients are able to view lab/test results, encounter notes, upcoming appointments, etc.  Non-urgent messages can be sent to your provider as well.   To learn more about what you can do with MyChart, go to [email or web address].    Your next appointment:   12 month(s)  Provider:   Sherryl Manges, MD  or Sherie Don, NP   Other Instructions  Please check and log your blood pressures 2-3 times a week and bring your log to your next Primary care visit.

## 2023-04-23 ENCOUNTER — Ambulatory Visit: Payer: Medicare HMO

## 2023-04-23 DIAGNOSIS — I442 Atrioventricular block, complete: Secondary | ICD-10-CM

## 2023-04-24 LAB — CUP PACEART REMOTE DEVICE CHECK
Battery Remaining Longevity: 96 mo
Battery Remaining Percentage: 81 %
Battery Voltage: 3.01 V
Brady Statistic AP VP Percent: 47 %
Brady Statistic AP VS Percent: 1 %
Brady Statistic AS VP Percent: 18 %
Brady Statistic AS VS Percent: 34 %
Brady Statistic RA Percent Paced: 47 %
Brady Statistic RV Percent Paced: 65 %
Date Time Interrogation Session: 20241118020036
Implantable Lead Connection Status: 753985
Implantable Lead Connection Status: 753985
Implantable Lead Implant Date: 20220817
Implantable Lead Implant Date: 20220817
Implantable Lead Location: 753859
Implantable Lead Location: 753860
Implantable Pulse Generator Implant Date: 20220817
Lead Channel Impedance Value: 430 Ohm
Lead Channel Impedance Value: 530 Ohm
Lead Channel Pacing Threshold Amplitude: 0.75 V
Lead Channel Pacing Threshold Amplitude: 0.75 V
Lead Channel Pacing Threshold Pulse Width: 0.5 ms
Lead Channel Pacing Threshold Pulse Width: 0.5 ms
Lead Channel Sensing Intrinsic Amplitude: 4.8 mV
Lead Channel Sensing Intrinsic Amplitude: 9.6 mV
Lead Channel Setting Pacing Amplitude: 1 V
Lead Channel Setting Pacing Amplitude: 1.75 V
Lead Channel Setting Pacing Pulse Width: 0.5 ms
Lead Channel Setting Sensing Sensitivity: 2 mV
Pulse Gen Model: 2272
Pulse Gen Serial Number: 3942808

## 2023-05-17 NOTE — Progress Notes (Signed)
Remote pacemaker transmission.   

## 2023-05-17 NOTE — Addendum Note (Signed)
Addended by: Geralyn Flash D on: 05/17/2023 09:58 AM   Modules accepted: Orders

## 2023-05-28 ENCOUNTER — Ambulatory Visit (INDEPENDENT_AMBULATORY_CARE_PROVIDER_SITE_OTHER): Payer: Medicare HMO | Admitting: Nurse Practitioner

## 2023-05-28 ENCOUNTER — Encounter: Payer: Self-pay | Admitting: Nurse Practitioner

## 2023-05-28 VITALS — BP 132/78 | HR 75 | Temp 97.7°F | Ht 72.5 in | Wt 198.8 lb

## 2023-05-28 DIAGNOSIS — Z23 Encounter for immunization: Secondary | ICD-10-CM | POA: Diagnosis not present

## 2023-05-28 DIAGNOSIS — Z0000 Encounter for general adult medical examination without abnormal findings: Secondary | ICD-10-CM | POA: Insufficient documentation

## 2023-05-28 DIAGNOSIS — E785 Hyperlipidemia, unspecified: Secondary | ICD-10-CM | POA: Diagnosis not present

## 2023-05-28 DIAGNOSIS — Z125 Encounter for screening for malignant neoplasm of prostate: Secondary | ICD-10-CM | POA: Diagnosis not present

## 2023-05-28 DIAGNOSIS — F411 Generalized anxiety disorder: Secondary | ICD-10-CM | POA: Diagnosis not present

## 2023-05-28 DIAGNOSIS — Z1159 Encounter for screening for other viral diseases: Secondary | ICD-10-CM | POA: Diagnosis not present

## 2023-05-28 DIAGNOSIS — I10 Essential (primary) hypertension: Secondary | ICD-10-CM | POA: Diagnosis not present

## 2023-05-28 DIAGNOSIS — Z8739 Personal history of other diseases of the musculoskeletal system and connective tissue: Secondary | ICD-10-CM | POA: Diagnosis not present

## 2023-05-28 DIAGNOSIS — K219 Gastro-esophageal reflux disease without esophagitis: Secondary | ICD-10-CM

## 2023-05-28 DIAGNOSIS — K900 Celiac disease: Secondary | ICD-10-CM

## 2023-05-28 LAB — URIC ACID: Uric Acid, Serum: 4.9 mg/dL (ref 4.0–7.8)

## 2023-05-28 LAB — COMPREHENSIVE METABOLIC PANEL
ALT: 15 U/L (ref 0–53)
AST: 15 U/L (ref 0–37)
Albumin: 4.4 g/dL (ref 3.5–5.2)
Alkaline Phosphatase: 81 U/L (ref 39–117)
BUN: 26 mg/dL — ABNORMAL HIGH (ref 6–23)
CO2: 27 meq/L (ref 19–32)
Calcium: 8.9 mg/dL (ref 8.4–10.5)
Chloride: 105 meq/L (ref 96–112)
Creatinine, Ser: 1.3 mg/dL (ref 0.40–1.50)
GFR: 53.31 mL/min — ABNORMAL LOW (ref >60.00)
Glucose, Bld: 109 mg/dL — ABNORMAL HIGH (ref 70–99)
Potassium: 4 meq/L (ref 3.5–5.1)
Sodium: 140 meq/L (ref 135–145)
Total Bilirubin: 0.7 mg/dL (ref 0.2–1.2)
Total Protein: 7 g/dL (ref 6.0–8.3)

## 2023-05-28 LAB — LIPID PANEL
Cholesterol: 113 mg/dL (ref 0–200)
HDL: 37.4 mg/dL — ABNORMAL LOW (ref >39.00)
LDL Cholesterol: 60 mg/dL (ref 0–99)
NonHDL: 76.01
Total CHOL/HDL Ratio: 3
Triglycerides: 80 mg/dL (ref 0.0–149.0)
VLDL: 16 mg/dL (ref 0.0–40.0)

## 2023-05-28 LAB — CBC
HCT: 38.9 % — ABNORMAL LOW (ref 39.0–52.0)
Hemoglobin: 12.8 g/dL — ABNORMAL LOW (ref 13.0–17.0)
MCHC: 33 g/dL (ref 30.0–36.0)
MCV: 91.5 fL (ref 78.0–100.0)
Platelets: 254 10*3/uL (ref 150.0–400.0)
RBC: 4.25 Mil/uL (ref 4.22–5.81)
RDW: 14.7 % (ref 11.5–15.5)
WBC: 8.7 10*3/uL (ref 4.0–10.5)

## 2023-05-28 LAB — PSA, MEDICARE: PSA: 0.34 ng/mL (ref 0.10–4.00)

## 2023-05-28 LAB — TSH: TSH: 1.78 u[IU]/mL (ref 0.35–5.50)

## 2023-05-28 NOTE — Assessment & Plan Note (Signed)
Patient currently maintained on atorvastatin 40 mg daily.  Continue.  Pending lipid panel

## 2023-05-28 NOTE — Assessment & Plan Note (Signed)
History of the same.  Patient currently maintained on allopurinol 100 mg daily.  Pending uric acid

## 2023-05-28 NOTE — Assessment & Plan Note (Signed)
Patient will take Klonopin 1 mg nightly as needed.  PDMP reviewed continue

## 2023-05-28 NOTE — Assessment & Plan Note (Signed)
Patient currently maintained omeprazole 20 mg daily continue

## 2023-05-28 NOTE — Assessment & Plan Note (Signed)
Patient is being followed by [facility].  Following gluten-free diet

## 2023-05-28 NOTE — Assessment & Plan Note (Signed)
Discussed age-appropriate immunizations and screening exams.  Did review patient's personal, surgical, social, family histories.  Patient is up-to-date with age-appropriate vaccinations he would like.  Will update flu vaccine today.  Patient to get the second shingles vaccine at local pharmacy along with tetanus vaccine.  Patient is up-to-date on CRC screening.  PSA for prostate cancer screening.  We did discuss advanced directives in office.  Patient was given information at discharge about preventative healthcare maintenance with anticipatory guidance.

## 2023-05-28 NOTE — Progress Notes (Signed)
New Patient Office Visit  Subjective    Patient ID: Thomas Monroe, male    DOB: 09-25-1945  Age: 77 y.o. MRN: [id number]  CC:  Chief Complaint  Patient presents with   Establish Care    General check up. States previous PCP retired. Would like flu shot.     HPI Thomas Monroe presents to establish care   Gout: states that he was on allopurinol 100 mg daily  HLD: Patient currently maintained on atorvastatin 40 mg and tolerates medication well.  Asthma: Patient currently maintained on Symbicort daily does not have albuterol inhaler at home.  States he has never had one.  When asked patient states that his tubes were narrowed and he takes the inhaler to open them.  Heart block: pacemaker and followed by cardiology.  Patient would like bloods at home  for complete physical and follow up of chronic conditions.  Immunizations: -Tetanus:  due patient to get local pharmacy -Influenza: update today -Shingles:  completed first dose get second dose at pharmacy if financial  obtainable -Pneumonia: up to date   Diet: Fair diet. 2 meals and snacks. He will drink zero pepsi  Exercise: No regular exercise. Employment   Eye exam: Completes annually once a year with glasses  Dental exam: Completes semi-annually    Colonoscopy: Completed in 01/02/2022 do not repeat due to age Lung Cancer Screening: N/A  PSA: Due  Sleep: goes to bed aroudn 10-11 and get up 3-4. Does not feel rested. State that his legs move around or jerk and does snore   Advance directive: Does not have 1.  States that his wife can make his decisions.  Patient does not have any family on his side nor does he have any children.    Outpatient Encounter Medications as of 05/28/2023  Medication Sig   allopurinol (ZYLOPRIM) 100 MG tablet Take 100 mg by mouth daily.   aspirin 81 MG tablet Take 81 mg by mouth daily.   atorvastatin (LIPITOR) 40 MG tablet Take 40 mg by mouth daily.   budesonide-formoterol (SYMBICORT)  160-4.5 MCG/ACT inhaler Inhale 2 puffs into the lungs daily.   clonazePAM (KLONOPIN) 1 MG tablet Take 1 mg by mouth as needed for anxiety.   ergocalciferol (VITAMIN D2) 1.25 MG (50000 UT) capsule Take 50,000 Units by mouth once a week.   folic acid (FOLVITE) 1 MG tablet Take 1 mg by mouth daily.    nitroGLYCERIN (NITROSTAT) 0.4 MG SL tablet Place 0.4 mg under the tongue every 5 (five) minutes as needed for chest pain.   omeprazole (PRILOSEC) 20 MG capsule Take 20 mg by mouth daily.   Tamsulosin HCl (FLOMAX) 0.4 MG CAPS Take 0.4 mg by mouth daily.    No facility-administered encounter medications on file as of 05/28/2023.    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   Depression    DM (diabetes mellitus) (HCC)    resolved   GERD (gastroesophageal reflux disease)    Gouty arthritis    Heart attack (HCC)    Hiatal hernia    History of kidney stones    HTN (hypertension)    Hypercholesterolemia     Past Surgical History:  Procedure Laterality Date   COLONOSCOPY  07/07/04   normal/left-side diverticula   COLONOSCOPY WITH  PROPOFOL N/A 03/09/2020   Procedure: COLONOSCOPY WITH PROPOFOL;  Surgeon: Regis Bill, MD;  Location: [facility] ENDOSCOPY;  Service: Endoscopy;  Laterality: N/A;   COLONOSCOPY WITH PROPOFOL N/A 01/02/2022   Procedure: COLONOSCOPY WITH PROPOFOL;  Surgeon: Regis Bill, MD;  Location: [facility] ENDOSCOPY;  Service: Endoscopy;  Laterality: N/A;   ESOPHAGOGASTRODUODENOSCOPY  07/07/04   normal/small HH/otherwise normal   ESOPHAGOGASTRODUODENOSCOPY (EGD) WITH PROPOFOL N/A 03/09/2020   Procedure: ESOPHAGOGASTRODUODENOSCOPY (EGD) WITH PROPOFOL;  Surgeon: Regis Bill, MD;  Location: [facility] ENDOSCOPY;  Service: Endoscopy;  Laterality: N/A;   ESOPHAGOGASTRODUODENOSCOPY (EGD) WITH PROPOFOL N/A 01/02/2022    Procedure: ESOPHAGOGASTRODUODENOSCOPY (EGD) WITH PROPOFOL;  Surgeon: Regis Bill, MD;  Location: [facility] ENDOSCOPY;  Service: Endoscopy;  Laterality: N/A;   KIDNEY STONE SURGERY     PACEMAKER IMPLANT N/A 01/19/2021   Procedure: PACEMAKER IMPLANT;  Surgeon: Lanier Prude, MD;  Location: [facility] INVASIVE CV LAB;  Service: Cardiovascular;  Laterality: N/A;   STAPLE HEMORRHOIDECTOMY  2013   Dr Evette Cristal    Family History  Problem Relation Age of Onset   Early death Mother 31   Heart disease Mother    Diabetes Mother    Alcohol abuse Brother    Alcohol abuse Brother    Colon cancer Neg Hx     Social History   Socioeconomic History   Marital status: Married    Spouse name: French Ana   Number of children: 0   Years of education: Not on file   Highest education level: Not on file  Occupational History   Occupation: Engineer, materials    Comment: [employer]  Tobacco Use   Smoking status: Former   Smokeless tobacco: Never  Advertising account planner   Vaping status: Never Used  Substance and Sexual Activity   Alcohol use: No   Drug use: No   Sexual activity: Not on file  Other Topics Concern   Not on file  Social History Narrative   Works Chartered loss adjuster    Social Drivers of Corporate investment banker Strain: Not on file  Food Insecurity: Not on file  Transportation Needs: Not on file  Physical Activity: Not on file  Stress: Not on file  Social Connections: Not on file  Intimate Partner Violence: Not on file    Review of Systems  Constitutional:  Negative for chills and fever.  Respiratory:  Negative for shortness of breath.   Cardiovascular:  Negative for chest pain and leg swelling.  Gastrointestinal:  Negative for abdominal pain, blood in stool, constipation, diarrhea, nausea and vomiting.       BM daily   Genitourinary:  Negative for dysuria and hematuria.       Nocturia x1  Neurological:  Negative for tingling and headaches.  Psychiatric/Behavioral:  Negative for  hallucinations and suicidal ideas.         Objective    BP 132/78   Pulse 75   Temp 97.7 F (36.5 C) (Oral)   Ht 6' 0.5" (1.842 m)   Wt 198 lb 12.8 oz (90.2 kg)   SpO2 98%   BMI 26.59 kg/m   Physical Exam Vitals and nursing note reviewed.  Constitutional:      Appearance: Normal appearance.  HENT:     Right Ear: Tympanic membrane, ear canal and external ear normal.     Left Ear: Tympanic membrane, ear canal and external ear normal.     Mouth/Throat:     Mouth: Mucous membranes are moist.     Pharynx: Oropharynx is  clear.  Eyes:     Extraocular Movements: Extraocular movements intact.     Pupils: Pupils are equal, round, and reactive to light.  Cardiovascular:     Rate and Rhythm: Normal rate and regular rhythm.     Pulses: Normal pulses.     Heart sounds: Normal heart sounds.  Pulmonary:     Effort: Pulmonary effort is normal.     Breath sounds: Normal breath sounds.  Abdominal:     General: Bowel sounds are normal. There is no distension.     Palpations: There is no mass.     Tenderness: There is no abdominal tenderness.     Hernia: No hernia is present.  Musculoskeletal:     Right lower leg: No edema.     Left lower leg: No edema.  Lymphadenopathy:     Cervical: No cervical adenopathy.  Skin:    General: Skin is warm.  Neurological:     General: No focal deficit present.     Mental Status: He is alert.     Deep Tendon Reflexes:     Reflex Scores:      Bicep reflexes are 2+ on the right side and 2+ on the left side.      Patellar reflexes are 2+ on the right side and 2+ on the left side.    Comments: Bilateral upper and lower extremity strength 5/5  Psychiatric:        Mood and Affect: Mood normal.        Behavior: Behavior normal.        Thought Content: Thought content normal.        Judgment: Judgment normal.         Assessment & Plan:   Problem List Items Addressed This Visit       Cardiovascular and Mediastinum   HTN (hypertension)    History of the same.  Patient used to be on hydrochlorothiazide in the past but was taken off by cardiology.  Blood pressure within normal limits continue lifestyle modifications only      Relevant Orders   CBC   Comprehensive metabolic panel   TSH     Digestive   Celiac disease   Patient is being followed by [facility].  Following gluten-free diet      Relevant Orders   CBC   Comprehensive metabolic panel   GERD (gastroesophageal reflux disease)   Patient currently maintained omeprazole 20 mg daily continue        Other   HLD (hyperlipidemia)   Patient currently maintained on atorvastatin 40 mg daily.  Continue.  Pending lipid panel      Relevant Orders   CBC   Comprehensive metabolic panel   Lipid panel   Preventative health care - Primary   Discussed age-appropriate immunizations and screening exams.  Did review patient's personal, surgical, social, family histories.  Patient is up-to-date with age-appropriate vaccinations he would like.  Will update flu vaccine today.  Patient to get the second shingles vaccine at local pharmacy along with tetanus vaccine.  Patient is up-to-date on CRC screening.  PSA for prostate cancer screening.  We did discuss advanced directives in office.  Patient was given information at discharge about preventative healthcare maintenance with anticipatory guidance.      Relevant Orders   TSH   GAD (generalized anxiety disorder)   Patient will take Klonopin 1 mg nightly as needed.  PDMP reviewed continue      Relevant Orders   TSH  History of gout   History of the same.  Patient currently maintained on allopurinol 100 mg daily.  Pending uric acid      Relevant Orders   Uric acid   Other Visit Diagnoses       Encounter for hepatitis C screening test for low risk patient       Relevant Orders   Hepatitis C Antibody     Screening for prostate cancer       Relevant Orders   PSA, Medicare     Need for influenza vaccination        Relevant Orders   Flu Vaccine Trivalent High Dose (Fluad) (Completed)       Return in about 6 months (around 11/26/2023) for BP recheck/gout/gad.   Audria Nine, NP

## 2023-05-28 NOTE — Patient Instructions (Signed)
Nice to see you today I will be in touch with the labs once I have them Follow up with me in 6 months for a recheck  Call the office at [phone] when you need medication refills

## 2023-05-28 NOTE — Assessment & Plan Note (Signed)
History of the same.  Patient used to be on hydrochlorothiazide in the past but was taken off by cardiology.  Blood pressure within normal limits continue lifestyle modifications only

## 2023-05-29 LAB — HEPATITIS C ANTIBODY: Hepatitis C Ab: NONREACTIVE

## 2023-05-31 ENCOUNTER — Encounter: Payer: Self-pay | Admitting: Cardiology

## 2023-05-31 ENCOUNTER — Telehealth: Payer: Self-pay | Admitting: Nurse Practitioner

## 2023-05-31 NOTE — Telephone Encounter (Signed)
Spoke to pt's wife, Kennith [place] (on [place]), told Cable's response regarding most recent results. Tracey didn't have any questions/concerns, stated she's relay messages to pt. Call back # [phone]

## 2023-06-05 ENCOUNTER — Telehealth: Payer: Self-pay | Admitting: *Deleted

## 2023-06-05 NOTE — Telephone Encounter (Signed)
 Copied from CRM [id number]. Topic: Clinical - Request for Lab/Test Order >> Jun 04, 2023  5:19 PM Tiffany H wrote: Reason for CRM: Patient's wife called in response to call out from Janae. Patient has spoken with staff several times about labs. Please call if there are new results that have not already been relayed.

## 2023-06-05 NOTE — Telephone Encounter (Signed)
 Per open phone note looks like pt wife received results.  No further action needed at this time.

## 2023-06-08 ENCOUNTER — Telehealth: Payer: Self-pay | Admitting: Internal Medicine

## 2023-06-08 NOTE — Telephone Encounter (Signed)
 Returned the call to the patient's wife, per the dpr. She stated that she spoke with someone a few weeks ago that stated that they would call her back to let her know a rough estimate of how much the echo would cost. She was calling for an update because she has not heard anything.

## 2023-06-08 NOTE — Telephone Encounter (Signed)
 Wife wanting to know if the pt is still needing to have an ECHO done. Please advise

## 2023-06-08 NOTE — Telephone Encounter (Signed)
 Created estimate for patient and it will be mailed (patient does not have MyChart).

## 2023-06-08 NOTE — Telephone Encounter (Signed)
 Pt's wife wants to know how much they are going to have to pay for the Echo. She would like the nurse to call her cell [phone]

## 2023-07-09 ENCOUNTER — Telehealth: Payer: Self-pay

## 2023-07-09 NOTE — Telephone Encounter (Signed)
LAST APPOINTMENT DATE: 06/05/2023 NEXT APPOINTMENT DATE: 11/26/2023 Unknown refill date for all meds listed    Folic Acid   Qty: #90   Clonazepam  Qty: #30 1MG   Allopurinol Qty: #90 100MG   Lipitor  QTY: #90  40MG 

## 2023-07-10 ENCOUNTER — Other Ambulatory Visit: Payer: Self-pay | Admitting: Nurse Practitioner

## 2023-07-10 MED ORDER — ALLOPURINOL 100 MG PO TABS: 100.0000 mg | ORAL_TABLET | Freq: Every day | ORAL | 1 refills | Status: DC

## 2023-07-10 MED ORDER — CLONAZEPAM 1 MG PO TABS
1.0000 mg | ORAL_TABLET | ORAL | 0 refills | Status: DC | PRN
Start: 2023-07-10 — End: 2023-11-27

## 2023-07-10 MED ORDER — ATORVASTATIN CALCIUM 40 MG PO TABS: 40.0000 mg | ORAL_TABLET | Freq: Every day | ORAL | 1 refills | Status: DC

## 2023-07-10 MED ORDER — FOLIC ACID 1 MG PO TABS: 1.0000 mg | ORAL_TABLET | Freq: Every day | ORAL | 0 refills | Status: DC

## 2023-07-12 ENCOUNTER — Encounter: Payer: Self-pay | Admitting: Cardiology

## 2023-07-12 ENCOUNTER — Other Ambulatory Visit: Payer: Self-pay | Admitting: Cardiology

## 2023-07-12 DIAGNOSIS — I442 Atrioventricular block, complete: Secondary | ICD-10-CM

## 2023-07-12 DIAGNOSIS — Z950 Presence of cardiac pacemaker: Secondary | ICD-10-CM

## 2023-07-12 DIAGNOSIS — I10 Essential (primary) hypertension: Secondary | ICD-10-CM

## 2023-07-12 DIAGNOSIS — I2510 Atherosclerotic heart disease of native coronary artery without angina pectoris: Secondary | ICD-10-CM

## 2023-07-23 ENCOUNTER — Ambulatory Visit (INDEPENDENT_AMBULATORY_CARE_PROVIDER_SITE_OTHER): Payer: Medicare HMO

## 2023-07-23 ENCOUNTER — Telehealth: Payer: Self-pay

## 2023-07-23 DIAGNOSIS — I442 Atrioventricular block, complete: Secondary | ICD-10-CM | POA: Diagnosis not present

## 2023-07-23 MED ORDER — TAMSULOSIN HCL 0.4 MG PO CAPS
0.4000 mg | ORAL_CAPSULE | Freq: Every day | ORAL | 1 refills | Status: DC
Start: 2023-07-23 — End: 2024-02-08

## 2023-07-23 NOTE — Telephone Encounter (Signed)
Pt need Office visit? [facility] sent fax for PCP to refill and not Dr. Horton Chin  LAST APPOINTMENT DATE: 05/28/2023  NEXT APPOINTMENT DATE: None.   Tamsulosin HCl 0.4 mg  LAST REFILL: 01/15/2012  QTY: Unknown.

## 2023-07-23 NOTE — Addendum Note (Signed)
Addended by: Eden Emms on: 07/23/2023 01:39 PM   Modules accepted: Orders

## 2023-07-24 ENCOUNTER — Ambulatory Visit: Payer: Medicare HMO | Attending: Cardiology

## 2023-07-24 DIAGNOSIS — I442 Atrioventricular block, complete: Secondary | ICD-10-CM

## 2023-07-24 DIAGNOSIS — Z950 Presence of cardiac pacemaker: Secondary | ICD-10-CM

## 2023-07-24 LAB — CUP PACEART REMOTE DEVICE CHECK
Battery Remaining Longevity: 94 mo
Battery Remaining Percentage: 79 %
Battery Voltage: 3.01 V
Brady Statistic AP VP Percent: 47 %
Brady Statistic AP VS Percent: 1 %
Brady Statistic AS VP Percent: 30 %
Brady Statistic AS VS Percent: 23 %
Brady Statistic RA Percent Paced: 47 %
Brady Statistic RV Percent Paced: 76 %
Date Time Interrogation Session: 20250217020012
Implantable Lead Connection Status: 753985
Implantable Lead Connection Status: 753985
Implantable Lead Implant Date: 20220817
Implantable Lead Implant Date: 20220817
Implantable Lead Location: 753859
Implantable Lead Location: 753860
Implantable Pulse Generator Implant Date: 20220817
Lead Channel Impedance Value: 410 Ohm
Lead Channel Impedance Value: 510 Ohm
Lead Channel Pacing Threshold Amplitude: 0.75 V
Lead Channel Pacing Threshold Amplitude: 0.75 V
Lead Channel Pacing Threshold Pulse Width: 0.5 ms
Lead Channel Pacing Threshold Pulse Width: 0.5 ms
Lead Channel Sensing Intrinsic Amplitude: 3.5 mV
Lead Channel Sensing Intrinsic Amplitude: 9.1 mV
Lead Channel Setting Pacing Amplitude: 1 V
Lead Channel Setting Pacing Amplitude: 1.75 V
Lead Channel Setting Pacing Pulse Width: 0.5 ms
Lead Channel Setting Sensing Sensitivity: 2 mV
Pulse Gen Model: 2272
Pulse Gen Serial Number: 3942808

## 2023-07-24 LAB — ECHOCARDIOGRAM COMPLETE
AR max vel: 3.17 cm2
AV Area VTI: 2.93 cm2
AV Area mean vel: 3.11 cm2
AV Mean grad: 3 mm[Hg]
AV Peak grad: 6.5 mm[Hg]
Ao pk vel: 1.27 m/s
Area-P 1/2: 2.89 cm2
Calc EF: 48.9 %
S' Lateral: 3.5 cm
Single Plane A2C EF: 52.1 %
Single Plane A4C EF: 43.8 %

## 2023-07-26 ENCOUNTER — Telehealth: Payer: Self-pay | Admitting: Internal Medicine

## 2023-07-26 NOTE — Telephone Encounter (Signed)
Patient's wife is returning call to discuss echo results. 

## 2023-07-27 NOTE — Telephone Encounter (Signed)
Attempted to contact patient, unable to leave message. Phone continued to ring. Will try again.

## 2023-07-30 ENCOUNTER — Ambulatory Visit: Payer: Medicare HMO

## 2023-07-30 VITALS — BP 110/70 | Ht 72.0 in | Wt 203.8 lb

## 2023-07-30 DIAGNOSIS — Z0000 Encounter for general adult medical examination without abnormal findings: Secondary | ICD-10-CM | POA: Diagnosis not present

## 2023-07-30 NOTE — Progress Notes (Addendum)
 Please attest and cosign this visit due to patients primary care provider not being in the office at the time the visit was completed.    Subjective:   KEIJUAN SCHELLHASE is a 78 y.o. who presents for a Medicare Wellness preventive visit.  Visit Complete: In person  AWV Questionnaire: No: Patient Medicare AWV questionnaire was not completed prior to this visit.  Cardiac Risk Factors include: advanced age (>109men, >71 women);male gender;dyslipidemia;hypertension;sedentary lifestyle     Objective:    Today's Vitals   07/30/23 0804  BP: 110/70  Weight: 203 lb 12.8 oz (92.4 kg)  Height: 6' (1.829 m)   Body mass index is 27.64 kg/m.     07/30/2023    8:24 AM 01/02/2022    8:52 AM 03/09/2020    9:05 AM 07/12/2016    8:28 PM  Advanced Directives  Does Patient Have a Medical Advance Directive? Yes No No No  Type of Estate agent of [place];Out of facility DNR (pink MOST or yellow form)     Copy of Healthcare Power of Attorney in Chart? No - copy requested     Would patient like information on creating a medical advance directive?   No - Patient declined     Current Medications (verified) Outpatient Encounter Medications as of 07/30/2023  Medication Sig   allopurinol (ZYLOPRIM) 100 MG tablet Take 1 tablet (100 mg total) by mouth daily.   aspirin 81 MG tablet Take 81 mg by mouth daily.   atorvastatin (LIPITOR) 40 MG tablet Take 1 tablet (40 mg total) by mouth daily.   budesonide-formoterol (SYMBICORT) 160-4.5 MCG/ACT inhaler Inhale 2 puffs into the lungs daily.   clonazePAM (KLONOPIN) 1 MG tablet Take 1 tablet (1 mg total) by mouth as needed for anxiety.   ergocalciferol (VITAMIN D2) 1.25 MG (50000 UT) capsule Take 50,000 Units by mouth once a week.   folic acid (FOLVITE) 1 MG tablet Take 1 tablet (1 mg total) by mouth daily.   nitroGLYCERIN (NITROSTAT) 0.4 MG SL tablet Place 0.4 mg under the tongue every 5 (five) minutes as needed for chest pain.   omeprazole  (PRILOSEC) 20 MG capsule Take 20 mg by mouth daily.   tamsulosin (FLOMAX) 0.4 MG CAPS capsule Take 1 capsule (0.4 mg total) by mouth daily.   No facility-administered encounter medications on file as of 07/30/2023.    Allergies (verified) Patient has no known allergies.   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   Depression    DM (diabetes mellitus) (HCC)    resolved   GERD (gastroesophageal reflux disease)    Gouty arthritis    Heart attack (HCC)    Hiatal hernia    History of kidney stones    HTN (hypertension)    Hypercholesterolemia    Past Surgical History:  Procedure Laterality Date   COLONOSCOPY  07/07/04   normal/left-side diverticula   COLONOSCOPY WITH PROPOFOL N/A 03/09/2020   Procedure: COLONOSCOPY WITH PROPOFOL;  Surgeon: Regis Bill, MD;  Location: [facility];  Service: Endoscopy;  Laterality: N/A;   COLONOSCOPY WITH PROPOFOL N/A 01/02/2022   Procedure: COLONOSCOPY WITH PROPOFOL;  Surgeon: Regis Bill, MD;  Location: [facility];  Service: Endoscopy;  Laterality: N/A;   ESOPHAGOGASTRODUODENOSCOPY  07/07/04   normal/small HH/otherwise  normal   ESOPHAGOGASTRODUODENOSCOPY (EGD) WITH PROPOFOL N/A 03/09/2020   Procedure: ESOPHAGOGASTRODUODENOSCOPY (EGD) WITH PROPOFOL;  Surgeon: Regis Bill, MD;  Location: [facility];  Service: Endoscopy;  Laterality: N/A;   ESOPHAGOGASTRODUODENOSCOPY (EGD) WITH PROPOFOL N/A 01/02/2022   Procedure: ESOPHAGOGASTRODUODENOSCOPY (EGD) WITH PROPOFOL;  Surgeon: Regis Bill, MD;  Location: [facility];  Service: Endoscopy;  Laterality: N/A;   KIDNEY STONE SURGERY     PACEMAKER IMPLANT N/A 01/19/2021   Procedure: PACEMAKER IMPLANT;  Surgeon: Lanier Prude, MD;  Location: [facility];  Service: Cardiovascular;   Laterality: N/A;   STAPLE HEMORRHOIDECTOMY  2013   Dr Evette Cristal   Family History  Problem Relation Age of Onset   Early death Mother 37   Heart disease Mother    Diabetes Mother    Alcohol abuse Brother    Alcohol abuse Brother    Colon cancer Neg Hx    Social History   Socioeconomic History   Marital status: Married    Spouse name: French Ana   Number of children: 0   Years of education: Not on file   Highest education level: Not on file  Occupational History   Occupation: Engineer, materials    Comment: [employer]  Tobacco Use   Smoking status: Former   Smokeless tobacco: Never  Advertising account planner   Vaping status: Never Used  Substance and Sexual Activity   Alcohol use: No   Drug use: No   Sexual activity: Not on file  Other Topics Concern   Not on file  Social History Narrative   Works Chartered loss adjuster    Social Drivers of Corporate investment banker Strain: Low Risk  (07/30/2023)   Overall Financial Resource Strain (CARDIA)    Difficulty of Paying Living Expenses: Not hard at all  Food Insecurity: No Food Insecurity (07/30/2023)   Hunger Vital Sign    Worried About Running Out of Food in the Last Year: Never true    Ran Out of Food in the Last Year: Never true  Transportation Needs: No Transportation Needs (07/30/2023)   PRAPARE - Administrator, Civil Service (Medical): No    Lack of Transportation (Non-Medical): No  Physical Activity: Inactive (07/30/2023)   Exercise Vital Sign    Days of Exercise per Week: 0 days    Minutes of Exercise per Session: 0 min  Stress: Stress Concern Present (07/30/2023)   [employer] of Occupational Health - Occupational Stress Questionnaire    Feeling of Stress : To some extent  Social Connections: Socially Isolated (07/30/2023)   Social Connection and Isolation Panel [NHANES]    Frequency of Communication with Friends and Family: Twice a week    Frequency of Social Gatherings with Friends and Family: Never    Attends Religious  Services: Never    Database administrator or Organizations: No    Attends Engineer, structural: Never    Marital Status: Married    Tobacco Counseling Counseling given: Not Answered  Clinical Intake:  Pre-visit preparation completed: Yes  Pain : No/denies pain    BMI - recorded: 27.64 Nutritional Status: BMI 25 -29 Overweight Nutritional Risks: None Diabetes: No  How often do you need to have someone help you when you read instructions, pamphlets, or other written materials from your doctor or pharmacy?: 1 - Never  Interpreter Needed?: No  Comments: lives with wife Information entered by :: B.Deloise Marchant,LPN   Activities of Daily Living     07/30/2023  8:24 AM  In your present state of health, do you have any difficulty performing the following activities:  Hearing? 0  Vision? 0  Difficulty concentrating or making decisions? 0  Walking or climbing stairs? 0  Dressing or bathing? 0  Doing errands, shopping? 0  Preparing Food and eating ? N  Using the Toilet? N  In the past six months, have you accidently leaked urine? N  Do you have problems with loss of bowel control? N  Managing your Medications? N  Managing your Finances? N  Housekeeping or managing your Housekeeping? N    Patient Care Team: Eden Emms, NP as PCP - General (Nurse Practitioner) Duke Salvia, MD as PCP - Electrophysiology (Cardiology) Jena Gauss Gerrit Friends, MD (Gastroenterology) [facility], [facility]  Indicate any recent Medical Services you may have received from other than [facility] providers in the past year (date may be approximate).     Assessment:   This is a routine wellness examination for Tyrick.  Hearing/Vision screen Hearing Screening - Comments:: Pt says his hearing is good Vision Screening - Comments:: Pt says his hearing is good Dr Clydene Pugh   Goals Addressed             This Visit's Progress    Weight (lb) < 190 lb (86.2 kg)   203 lb 12.8 oz (92.4 kg)    I  would like to lose about 20lb: by quit eating out and junk       Depression Screen     07/30/2023    8:15 AM 05/28/2023    9:57 AM  PHQ 2/9 Scores  PHQ - 2 Score 0 5  PHQ- 9 Score  12    Fall Risk     07/30/2023    8:11 AM 05/28/2023    9:58 AM 10/17/2018    1:26 PM  Fall Risk   Falls in the past year? 0 0 0  Number falls in past yr: 0 0 0  Injury with Fall? 0 0 0  Risk for fall due to : No Fall Risks No Fall Risks   Follow up Education provided;Falls prevention discussed Falls evaluation completed     MEDICARE RISK AT HOME:  Medicare Risk at Home Any stairs in or around the home?: No If so, are there any without handrails?: No Home free of loose throw rugs in walkways, pet beds, electrical cords, etc?: Yes Adequate lighting in your home to reduce risk of falls?: Yes Life alert?: No Use of a cane, walker or w/c?: No Grab bars in the bathroom?: Yes Shower chair or bench in shower?: No Elevated toilet seat or a handicapped toilet?: No  TIMED UP AND GO:  Was the test performed?  Yes  Length of time to ambulate 10 feet: 12 sec Gait steady and fast without use of assistive device  Cognitive Function: 6CIT completed        07/30/2023    8:26 AM  6CIT Screen  What Year? 0 points  What month? 0 points  What time? 0 points  Count back from 20 0 points  Months in reverse 2 points  Repeat phrase 6 points  Total Score 8 points    Immunizations Immunization History  Administered Date(s) Administered   Fluad Trivalent(High Dose 65+) 05/28/2023   PFIZER(Purple Top)SARS-COV-2 Vaccination 07/27/2019, 08/20/2019   Tdap 06/02/2010   Zoster Recombinant(Shingrix) 01/04/2019    Screening Tests Health Maintenance  Topic Date Due   Pneumonia Vaccine 103+ Years old (1  of 2 - PCV) Never done   Zoster Vaccines- Shingrix (2 of 2) 03/01/2019   DTaP/Tdap/Td (2 - Td or Tdap) 06/02/2020   COVID-19 Vaccine (3 - 2024-25 season) 05/27/2024 (Originally 02/04/2023)   Medicare Annual  Wellness (AWV)  07/29/2024   INFLUENZA VACCINE  Completed   Hepatitis C Screening  Completed   HPV VACCINES  Aged Out   Colonoscopy  Discontinued    Health Maintenance  Health Maintenance Due  Topic Date Due   Pneumonia Vaccine 23+ Years old (1 of 2 - PCV) Never done   Zoster Vaccines- Shingrix (2 of 2) 03/01/2019   DTaP/Tdap/Td (2 - Td or Tdap) 06/02/2020   Health Maintenance Items Addressed: Pt says he has Pneumonia vaccine 12/24 at [facility]   Additional Screening:  Vision Screening: Recommended annual ophthalmology exams for early detection of glaucoma and other disorders of the eye.  Dental Screening: Recommended annual dental exams for proper oral hygiene  Community Resource Referral / Chronic Care Management: CRR required this visit?  No   CCM required this visit?  No     Plan:     I have personally reviewed and noted the following in the patient's chart:   Medical and social history Use of alcohol, tobacco or illicit drugs  Current medications and supplements including opioid prescriptions. Patient is not currently taking opioid prescriptions. Functional ability and status Nutritional status Physical activity Advanced directives List of other physicians Hospitalizations, surgeries, and ER visits in previous 12 months Vitals Screenings to include cognitive, depression, and falls Referrals and appointments  In addition, I have reviewed and discussed with patient certain preventive protocols, quality metrics, and best practice recommendations. A written personalized care plan for preventive services as well as general preventive health recommendations were provided to patient.    Sue Lush, LPN   0/45/4098   After Visit Summary: (In Person-Declined) Patient declined AVS at this time.  Notes: Nothing significant to report at this time.

## 2023-07-30 NOTE — Telephone Encounter (Signed)
 Attempted again today to contact patient.   Unable to reach anyone.   Will try again later.

## 2023-07-30 NOTE — Patient Instructions (Signed)
 Thomas Monroe , Thank you for taking time to come for your Medicare Wellness Visit. I appreciate your ongoing commitment to your health goals. Please review the following plan we discussed and let me know if I can assist you in the future.   Referrals/Orders/Follow-Ups/Clinician Recommendations: none  This is a list of the screening recommended for you and due dates:  Health Maintenance  Topic Date Due   Pneumonia Vaccine (1 of 2 - PCV) Never done   Zoster (Shingles) Vaccine (2 of 2) 03/01/2019   DTaP/Tdap/Td vaccine (2 - Td or Tdap) 06/02/2020   COVID-19 Vaccine (3 - 2024-25 season) 05/27/2024*   Medicare Annual Wellness Visit  07/29/2024   Flu Shot  Completed   Hepatitis C Screening  Completed   HPV Vaccine  Aged Out   Colon Cancer Screening  Discontinued  *Topic was postponed. The date shown is not the original due date.    Advanced directives: (Copy Requested) Please bring a copy of your health care power of attorney and living will to the office to be added to your chart at your convenience.  Next Medicare Annual Wellness Visit scheduled for next year: Yes 07/31/23 @ 8:10 am in person

## 2023-08-08 NOTE — Telephone Encounter (Signed)
 Mailed letter with results.

## 2023-08-29 NOTE — Progress Notes (Signed)
 Remote pacemaker transmission.

## 2023-08-29 NOTE — Addendum Note (Signed)
 Addended by: Geralyn Flash D on: 08/29/2023 03:22 PM   Modules accepted: Orders

## 2023-10-22 ENCOUNTER — Ambulatory Visit (INDEPENDENT_AMBULATORY_CARE_PROVIDER_SITE_OTHER): Payer: Medicare HMO

## 2023-10-22 ENCOUNTER — Other Ambulatory Visit: Payer: Self-pay | Admitting: Nurse Practitioner

## 2023-10-22 DIAGNOSIS — I442 Atrioventricular block, complete: Secondary | ICD-10-CM

## 2023-10-22 LAB — CUP PACEART REMOTE DEVICE CHECK
Battery Remaining Longevity: 91 mo
Battery Remaining Percentage: 76 %
Battery Voltage: 3.01 V
Brady Statistic AP VP Percent: 45 %
Brady Statistic AP VS Percent: 1 %
Brady Statistic AS VP Percent: 33 %
Brady Statistic AS VS Percent: 22 %
Brady Statistic RA Percent Paced: 45 %
Brady Statistic RV Percent Paced: 77 %
Date Time Interrogation Session: 20250519020015
Implantable Lead Connection Status: 753985
Implantable Lead Connection Status: 753985
Implantable Lead Implant Date: 20220817
Implantable Lead Implant Date: 20220817
Implantable Lead Location: 753859
Implantable Lead Location: 753860
Implantable Pulse Generator Implant Date: 20220817
Lead Channel Impedance Value: 430 Ohm
Lead Channel Impedance Value: 530 Ohm
Lead Channel Pacing Threshold Amplitude: 0.75 V
Lead Channel Pacing Threshold Amplitude: 0.75 V
Lead Channel Pacing Threshold Pulse Width: 0.5 ms
Lead Channel Pacing Threshold Pulse Width: 0.5 ms
Lead Channel Sensing Intrinsic Amplitude: 11.2 mV
Lead Channel Sensing Intrinsic Amplitude: 3.5 mV
Lead Channel Setting Pacing Amplitude: 1 V
Lead Channel Setting Pacing Amplitude: 1.75 V
Lead Channel Setting Pacing Pulse Width: 0.5 ms
Lead Channel Setting Sensing Sensitivity: 2 mV
Pulse Gen Model: 2272
Pulse Gen Serial Number: 3942808

## 2023-10-31 ENCOUNTER — Ambulatory Visit: Payer: Self-pay | Admitting: Cardiovascular Disease

## 2023-11-26 ENCOUNTER — Ambulatory Visit: Payer: Medicare HMO | Admitting: Nurse Practitioner

## 2023-11-27 ENCOUNTER — Ambulatory Visit (INDEPENDENT_AMBULATORY_CARE_PROVIDER_SITE_OTHER): Admitting: Nurse Practitioner

## 2023-11-27 VITALS — BP 118/80 | HR 65 | Temp 97.9°F | Ht 72.0 in | Wt 211.4 lb

## 2023-11-27 DIAGNOSIS — G479 Sleep disorder, unspecified: Secondary | ICD-10-CM | POA: Diagnosis not present

## 2023-11-27 DIAGNOSIS — I10 Essential (primary) hypertension: Secondary | ICD-10-CM

## 2023-11-27 DIAGNOSIS — Z8739 Personal history of other diseases of the musculoskeletal system and connective tissue: Secondary | ICD-10-CM | POA: Diagnosis not present

## 2023-11-27 DIAGNOSIS — F411 Generalized anxiety disorder: Secondary | ICD-10-CM

## 2023-11-27 LAB — BASIC METABOLIC PANEL WITH GFR
BUN: 20 mg/dL (ref 6–23)
CO2: 29 meq/L (ref 19–32)
Calcium: 9.2 mg/dL (ref 8.4–10.5)
Chloride: 103 meq/L (ref 96–112)
Creatinine, Ser: 1.42 mg/dL (ref 0.40–1.50)
GFR: 47.78 mL/min — ABNORMAL LOW (ref >60.00)
Glucose, Bld: 125 mg/dL — ABNORMAL HIGH (ref 70–99)
Potassium: 4.5 meq/L (ref 3.5–5.1)
Sodium: 138 meq/L (ref 135–145)

## 2023-11-27 LAB — CBC
HCT: 35.8 % — ABNORMAL LOW (ref 39.0–52.0)
Hemoglobin: 11.9 g/dL — ABNORMAL LOW (ref 13.0–17.0)
MCHC: 33.3 g/dL (ref 30.0–36.0)
MCV: 89.8 fl (ref 78.0–100.0)
Platelets: 215 10*3/uL (ref 150.0–400.0)
RBC: 3.98 Mil/uL — ABNORMAL LOW (ref 4.22–5.81)
RDW: 14.6 % (ref 11.5–15.5)
WBC: 7.4 10*3/uL (ref 4.0–10.5)

## 2023-11-27 MED ORDER — TRAZODONE HCL 50 MG PO TABS: 25.0000 mg | ORAL_TABLET | Freq: Every evening | ORAL | 0 refills | Status: DC | PRN

## 2023-11-27 MED ORDER — CLONAZEPAM 1 MG PO TABS: 1.0000 mg | ORAL_TABLET | ORAL | 0 refills | Status: DC | PRN

## 2023-11-27 NOTE — Patient Instructions (Signed)
Nice to see you today I will be in touch with the labs once I have them Follow up with me in 6 months for your physical, sooner if you need me 

## 2023-11-27 NOTE — Assessment & Plan Note (Signed)
 History of the same.  Has tried melatonin without great relief.  Has tried Ambien while in the hospital stating it worked well.  Will trial patient on trazodone 25 to 50 mg nightly as needed.

## 2023-11-27 NOTE — Assessment & Plan Note (Signed)
 Patient currently maintained on allopurinol  100 mg daily.  Last uric acid reviewed.  Patient denies any gout flares continue allopurinol  100 mg daily

## 2023-11-27 NOTE — Assessment & Plan Note (Signed)
 History of the same.  Patient currently maintained on Klonopin  1 mg daily as needed.  Patient has used 30 tablets over the past 4 months with infrequent use.  Refill provided today.  Patient denies HI/SI/AVH

## 2023-11-27 NOTE — Assessment & Plan Note (Signed)
 Patient's blood pressure well-controlled.  Patient not on any antihypertensives at this juncture.  Continues working lifestyle modifications

## 2023-11-27 NOTE — Progress Notes (Signed)
 Established Patient Office Visit  Subjective   Patient ID: Thomas Monroe, male    DOB: 1945/10/05  Age: 78 y.o. MRN: [id number]  Chief Complaint  Patient presents with   Follow-up   Medication Refill    Nitroglycerin and Clonazepam      HPI  HTN: Patient used to be on hydrochlorothiazide in the past This was discontinued by cardiology.  Patient currently maintained on lifestyle modifications only State that he does a lot of walking   GAD: Patient currently maintained on clonazepam  1 mg daily as needed.  States that he has not had any falls since our last visit. 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 He has tried prescription medications before that helps relax and sleep. States that he has tried ambien in the past and it did well   Gout: Patient currently maintained on allopurinol  100 mg daily. States that he has not had any gout flares      11/27/2023    8:25 AM 07/30/2023    8:15 AM 05/28/2023    9:57 AM  PHQ9 SCORE ONLY  PHQ-9 Total Score 9 0 12       11/27/2023    8:25 AM 05/28/2023    9:57 AM  GAD 7 : Generalized Anxiety Score  Nervous, Anxious, on Edge 1 2  Control/stop worrying 1 2  Worry too much - different things 1 2  Trouble relaxing 1 2  Restless 0 0  Easily annoyed or irritable 1 1  Afraid - awful might happen 1 1  Total GAD 7 Score 6 10  Anxiety Difficulty Somewhat difficult Somewhat difficult        Review of Systems  Constitutional:  Negative for chills and fever.  Respiratory:  Negative for shortness of breath.   Cardiovascular:  Negative for chest pain.  Gastrointestinal:        BM daily   Neurological:  Negative for headaches.  Psychiatric/Behavioral:  Negative for hallucinations and suicidal ideas.       Objective:     BP 118/80   Pulse 65   Temp 97.9 F (36.6 C) (Oral)   Ht 6' (1.829 m)   Wt 211 lb 6.4 oz (95.9 kg)    SpO2 98%   BMI 28.67 kg/m  BP Readings from Last 3 Encounters:  11/27/23 118/80  07/30/23 110/70  05/28/23 132/78   Wt Readings from Last 3 Encounters:  11/27/23 211 lb 6.4 oz (95.9 kg)  07/30/23 203 lb 12.8 oz (92.4 kg)  05/28/23 198 lb 12.8 oz (90.2 kg)   SpO2 Readings from Last 3 Encounters:  11/27/23 98%  05/28/23 98%  04/17/23 98%      Physical Exam Vitals and nursing note reviewed.  Constitutional:      Appearance: Normal appearance.   Cardiovascular:     Rate and Rhythm: Normal rate and regular rhythm.     Heart sounds: Normal heart sounds.  Pulmonary:     Effort: Pulmonary effort is normal.     Breath sounds: Normal breath sounds.  Abdominal:     Comments: BS WNL   Neurological:     Mental Status: He is alert.      No results found for any visits on 11/27/23.    The ASCVD Risk score (Arnett DK, et al., 2019) failed to calculate for the following reasons:   Risk score cannot  be calculated because patient has a medical history suggesting prior/existing ASCVD    Assessment & Plan:   Problem List Items Addressed This Visit       Cardiovascular and Mediastinum   HTN (hypertension) - Primary   Patient's blood pressure well-controlled.  Patient not on any antihypertensives at this juncture.  Continues working lifestyle modifications      Relevant Orders   CBC   Basic metabolic panel with GFR     Other   GAD (generalized anxiety disorder)   History of the same.  Patient currently maintained on Klonopin  1 mg daily as needed.  Patient has used 30 tablets over the past 4 months with infrequent use.  Refill provided today.  Patient denies HI/SI/AVH      Relevant Medications   clonazePAM  (KLONOPIN ) 1 MG tablet   traZODone (DESYREL) 50 MG tablet   History of gout   Patient currently maintained on allopurinol  100 mg daily.  Last uric acid reviewed.  Patient denies any gout flares continue allopurinol  100 mg daily      Sleep disturbance   History of  the same.  Has tried melatonin without great relief.  Has tried Ambien while in the hospital stating it worked well.  Will trial patient on trazodone 25 to 50 mg nightly as needed.      Relevant Medications   traZODone (DESYREL) 50 MG tablet    Return in about 6 months (around 05/28/2024) for CPE and Labs.    Adina Crandall, NP

## 2023-11-29 ENCOUNTER — Telehealth: Payer: Self-pay

## 2023-11-29 ENCOUNTER — Ambulatory Visit: Payer: Self-pay | Admitting: Nurse Practitioner

## 2023-11-29 NOTE — Telephone Encounter (Signed)
 Copied from CRM [id number]. Topic: General - Other >> Nov 29, 2023  9:47 AM Burnard DEL wrote: Reason for CRM: Patients wife called  in regarding lab results,I relayed message to her from provider. She verbalized understanding. She also wanted to let Adina know that the sleeping pills that he was prescribed trazadone did help him sleep.H e has only taken one,but will  take more this  weekend when he doesn't have to work and she will call to let Adina know how he does with taking a few more pills in a row.

## 2023-12-10 NOTE — Progress Notes (Signed)
 Remote pacemaker transmission.

## 2023-12-17 ENCOUNTER — Telehealth: Payer: Self-pay | Admitting: Nurse Practitioner

## 2023-12-17 NOTE — Telephone Encounter (Signed)
 See how much of the trazodone  he is taking and if it is helping

## 2023-12-17 NOTE — Telephone Encounter (Signed)
-----   Message from [facility] sent at 11/27/2023  8:47 AM EDT ----- Regarding: trazodone  See how much of the trazodone  he is taking and if it is helping

## 2023-12-18 NOTE — Telephone Encounter (Signed)
 Called pt. No answer and Unable to leave voicemail.

## 2023-12-20 NOTE — Telephone Encounter (Signed)
 Called pt. No answer and Unable to leave voicemail.

## 2023-12-21 NOTE — Telephone Encounter (Signed)
Sent letter to pt address on file.

## 2023-12-21 NOTE — Telephone Encounter (Signed)
 Called pt. No answer and Unable to leave voicemail.

## 2023-12-31 NOTE — Telephone Encounter (Signed)
 I assume this is in regards to the trazodone ? The last note I got was prednisone?  If he is having an adverse event or feels like it is too strong then discontinue the trazodone 

## 2023-12-31 NOTE — Telephone Encounter (Unsigned)
 Copied from CRM [id number]. Topic: Clinical - Medication Question >> Dec 31, 2023  4:21 PM Abigail D wrote: Reason for CRM: Patient is returning Lynwood Crandall regarding prednisone, 3 halves and the results have not been good. He took 1 and he slept for 10 hours. Patient said if anything is needed call/text @, he said it's best to text because he can't answer while at work. He said no communication is necessary unless Lynwood sees fit. [phone]

## 2024-01-01 NOTE — Telephone Encounter (Signed)
 Called pt. No answer and Unable to leave voicemail.

## 2024-01-02 NOTE — Telephone Encounter (Signed)
 Called pt. No answer and Unable to leave voicemail.

## 2024-01-04 NOTE — Telephone Encounter (Signed)
 Called pt. No answer and Unable to leave voicemail.    Mail letter?

## 2024-01-13 ENCOUNTER — Other Ambulatory Visit: Payer: Self-pay | Admitting: Nurse Practitioner

## 2024-01-21 ENCOUNTER — Ambulatory Visit (INDEPENDENT_AMBULATORY_CARE_PROVIDER_SITE_OTHER): Payer: Medicare HMO

## 2024-01-21 DIAGNOSIS — I442 Atrioventricular block, complete: Secondary | ICD-10-CM

## 2024-01-22 LAB — CUP PACEART REMOTE DEVICE CHECK
Battery Remaining Longevity: 89 mo
Battery Remaining Percentage: 74 %
Battery Voltage: 3.01 V
Brady Statistic AP VP Percent: 42 %
Brady Statistic AP VS Percent: 1 %
Brady Statistic AS VP Percent: 36 %
Brady Statistic AS VS Percent: 22 %
Brady Statistic RA Percent Paced: 42 %
Brady Statistic RV Percent Paced: 77 %
Date Time Interrogation Session: 20250818020017
Implantable Lead Connection Status: 753985
Implantable Lead Connection Status: 753985
Implantable Lead Implant Date: 20220817
Implantable Lead Implant Date: 20220817
Implantable Lead Location: 753859
Implantable Lead Location: 753860
Implantable Pulse Generator Implant Date: 20220817
Lead Channel Impedance Value: 450 Ohm
Lead Channel Impedance Value: 540 Ohm
Lead Channel Pacing Threshold Amplitude: 0.75 V
Lead Channel Pacing Threshold Amplitude: 1.25 V
Lead Channel Pacing Threshold Pulse Width: 0.5 ms
Lead Channel Pacing Threshold Pulse Width: 0.5 ms
Lead Channel Sensing Intrinsic Amplitude: 4.1 mV
Lead Channel Sensing Intrinsic Amplitude: 9 mV
Lead Channel Setting Pacing Amplitude: 1 V
Lead Channel Setting Pacing Amplitude: 2.25 V
Lead Channel Setting Pacing Pulse Width: 0.5 ms
Lead Channel Setting Sensing Sensitivity: 2 mV
Pulse Gen Model: 2272
Pulse Gen Serial Number: 3942808

## 2024-02-06 ENCOUNTER — Other Ambulatory Visit: Payer: Self-pay | Admitting: Nurse Practitioner

## 2024-02-06 DIAGNOSIS — F411 Generalized anxiety disorder: Secondary | ICD-10-CM

## 2024-02-08 ENCOUNTER — Other Ambulatory Visit: Payer: Self-pay | Admitting: Nurse Practitioner

## 2024-02-08 DIAGNOSIS — F411 Generalized anxiety disorder: Secondary | ICD-10-CM

## 2024-02-27 NOTE — Progress Notes (Signed)
 Remote PPM Transmission

## 2024-04-10 ENCOUNTER — Other Ambulatory Visit: Payer: Self-pay | Admitting: Nurse Practitioner

## 2024-04-10 DIAGNOSIS — G479 Sleep disorder, unspecified: Secondary | ICD-10-CM

## 2024-04-14 ENCOUNTER — Ambulatory Visit: Payer: Self-pay | Admitting: Cardiology

## 2024-04-21 ENCOUNTER — Ambulatory Visit: Payer: Medicare HMO

## 2024-04-21 DIAGNOSIS — I442 Atrioventricular block, complete: Secondary | ICD-10-CM | POA: Diagnosis not present

## 2024-04-22 LAB — CUP PACEART REMOTE DEVICE CHECK
Battery Remaining Longevity: 82 mo
Battery Remaining Percentage: 71 %
Battery Voltage: 3.01 V
Brady Statistic AP VP Percent: 41 %
Brady Statistic AP VS Percent: 1 %
Brady Statistic AS VP Percent: 37 %
Brady Statistic AS VS Percent: 22 %
Brady Statistic RA Percent Paced: 41 %
Brady Statistic RV Percent Paced: 78 %
Date Time Interrogation Session: 20251117020014
Implantable Lead Connection Status: 753985
Implantable Lead Connection Status: 753985
Implantable Lead Implant Date: 20220817
Implantable Lead Implant Date: 20220817
Implantable Lead Location: 753859
Implantable Lead Location: 753860
Implantable Pulse Generator Implant Date: 20220817
Lead Channel Impedance Value: 400 Ohm
Lead Channel Impedance Value: 480 Ohm
Lead Channel Pacing Threshold Amplitude: 0.75 V
Lead Channel Pacing Threshold Amplitude: 1 V
Lead Channel Pacing Threshold Pulse Width: 0.5 ms
Lead Channel Pacing Threshold Pulse Width: 0.5 ms
Lead Channel Sensing Intrinsic Amplitude: 1.5 mV
Lead Channel Sensing Intrinsic Amplitude: 6.6 mV
Lead Channel Setting Pacing Amplitude: 1 V
Lead Channel Setting Pacing Amplitude: 2 V
Lead Channel Setting Pacing Pulse Width: 0.5 ms
Lead Channel Setting Sensing Sensitivity: 2 mV
Pulse Gen Model: 2272
Pulse Gen Serial Number: 3942808

## 2024-04-23 ENCOUNTER — Ambulatory Visit: Payer: Self-pay | Admitting: Cardiology

## 2024-04-23 NOTE — Progress Notes (Signed)
 Remote PPM Transmission

## 2024-05-05 ENCOUNTER — Other Ambulatory Visit: Payer: Self-pay | Admitting: Nurse Practitioner

## 2024-05-05 DIAGNOSIS — G479 Sleep disorder, unspecified: Secondary | ICD-10-CM

## 2024-05-08 ENCOUNTER — Ambulatory Visit (INDEPENDENT_AMBULATORY_CARE_PROVIDER_SITE_OTHER): Admitting: Nurse Practitioner

## 2024-05-08 ENCOUNTER — Encounter: Payer: Self-pay | Admitting: Nurse Practitioner

## 2024-05-08 VITALS — BP 158/76 | HR 80 | Temp 98.0°F | Ht 72.0 in | Wt 211.0 lb

## 2024-05-08 DIAGNOSIS — K219 Gastro-esophageal reflux disease without esophagitis: Secondary | ICD-10-CM

## 2024-05-08 DIAGNOSIS — I10 Essential (primary) hypertension: Secondary | ICD-10-CM | POA: Diagnosis not present

## 2024-05-08 DIAGNOSIS — I442 Atrioventricular block, complete: Secondary | ICD-10-CM | POA: Diagnosis not present

## 2024-05-08 DIAGNOSIS — G479 Sleep disorder, unspecified: Secondary | ICD-10-CM

## 2024-05-08 DIAGNOSIS — Z0000 Encounter for general adult medical examination without abnormal findings: Secondary | ICD-10-CM | POA: Diagnosis not present

## 2024-05-08 DIAGNOSIS — Z23 Encounter for immunization: Secondary | ICD-10-CM | POA: Diagnosis not present

## 2024-05-08 DIAGNOSIS — Z125 Encounter for screening for malignant neoplasm of prostate: Secondary | ICD-10-CM

## 2024-05-08 DIAGNOSIS — E785 Hyperlipidemia, unspecified: Secondary | ICD-10-CM

## 2024-05-08 DIAGNOSIS — Z131 Encounter for screening for diabetes mellitus: Secondary | ICD-10-CM

## 2024-05-08 DIAGNOSIS — Z8739 Personal history of other diseases of the musculoskeletal system and connective tissue: Secondary | ICD-10-CM

## 2024-05-08 DIAGNOSIS — F411 Generalized anxiety disorder: Secondary | ICD-10-CM

## 2024-05-08 DIAGNOSIS — Z126 Encounter for screening for malignant neoplasm of bladder: Secondary | ICD-10-CM

## 2024-05-08 NOTE — Patient Instructions (Addendum)
 We have you set up to come to our office on 07/01/22 at 4:00 PM for our Tdap vaccination with the pharmacy team.   Nice to see you today  I will be in touch with the labs We did update your flu vaccine today  Follow up with me in 1 month

## 2024-05-08 NOTE — Assessment & Plan Note (Signed)
 Discussed age-appropriate immunizations and screening exams.  Did review patient's personal, surgical, social, family histories.  Patient up-to-date on all age-appropriate vaccinations.  Update flu vaccine today.  Patient was scheduled for tetanus vaccine with vaccine clinic.  Patient up-to-date on CRC screening.  PSA for prostate cancer screening today.  Patient was given information at discharge about preventative healthcare maintenance with anticipatory guidance.

## 2024-05-08 NOTE — Assessment & Plan Note (Signed)
 Currently maintained on 25 mg of trazodone  nightly.  Does well.  Patient gets 6 hours of sleep with medication.  Continue

## 2024-05-08 NOTE — Addendum Note (Signed)
 Addended by: HOPE VEVA PARAS on: 05/08/2024 02:41 PM   Modules accepted: Orders

## 2024-05-08 NOTE — Assessment & Plan Note (Signed)
 Currently maintained on Klonopin  1 mg daily as needed.  Stable patient denies HI/SI/AVH.  Continue medication as prescribed

## 2024-05-08 NOTE — Progress Notes (Signed)
 Established Patient Office Visit  Subjective   Patient ID: Thomas Monroe, male    DOB: [birth year]  Age: 78 y.o. MRN: [id number]  Chief Complaint  Patient presents with   Annual Exam    HPI  HTN: Currently maintained on occasions only.  Patient is followed by cardiology for complete heart block. He does check it at home sometimes. States that he has had episodes with it being up   Gout: Patient currently maintained on allopurinol  100 mg daily. No recent gout flares/atacks  Insomnia: Maintained on trazodone  25 to 50 mg nightly as needed  BPH: Currently maintained on tamsulosin  0.4 mg daily. Go stream and he wil have 1 episode of nocturia  GERD: Currently maintained on omeprazole 20 mg daily.  GAD: Maintained on Klonopin  1 mg daily as needed. Does well with this    for complete physical and follow up of chronic conditions.  Immunizations: -Tetanus: Completed in 2011, scheduled for vaccine clinic -Influenza: update today  -Shingles: Completed the series  -Pneumonia:2023  Diet: Fair diet. He will eat 1 meal a day. He will snack sometimes at work. He has to avoid gluten. He drinks pepsi and some water  Exercise: No regular exercise. Employment   Eye exam: Completes annually. Has glasses Dr. Carlin in [place]   Dental exam: needs updating     Colonoscopy: Completed in 01/02/2022, no repeat Due to age Lung Cancer Screening:?  PSA: Due  Sleep: getting to bed around 9-930 and will get up around 4. He feels rested with the trazodone . He will take a half tablet to sleep and get 6 hours   Advanced directive: does not have one       Review of Systems  Constitutional:  Negative for chills and fever.  Respiratory:  Negative for shortness of breath.   Cardiovascular:  Negative for chest pain and leg swelling.  Gastrointestinal:  Negative for abdominal pain, blood in stool, constipation, diarrhea, nausea and vomiting.       BM daily   Genitourinary:  Negative for dysuria and  hematuria.  Neurological:  Positive for dizziness (intermittent with work and going up and down stairs). Negative for tingling and headaches.  Psychiatric/Behavioral:  Negative for hallucinations and suicidal ideas.       Objective:     BP (!) 158/76   Pulse 80   Temp 98 F (36.7 C) (Oral)   Ht 6' (1.829 m)   Wt 211 lb (95.7 kg)   SpO2 98%   BMI 28.62 kg/m  BP Readings from Last 3 Encounters:  05/08/24 (!) 158/76  11/27/23 118/80  07/30/23 110/70   Wt Readings from Last 3 Encounters:  05/08/24 211 lb (95.7 kg)  11/27/23 211 lb 6.4 oz (95.9 kg)  07/30/23 203 lb 12.8 oz (92.4 kg)   SpO2 Readings from Last 3 Encounters:  05/08/24 98%  11/27/23 98%  05/28/23 98%      Physical Exam Vitals and nursing note reviewed.  Constitutional:      Appearance: Normal appearance.  HENT:     Right Ear: Tympanic membrane, ear canal and external ear normal.     Left Ear: Tympanic membrane, ear canal and external ear normal.     Mouth/Throat:     Mouth: Mucous membranes are moist.     Pharynx: Oropharynx is clear.  Eyes:     Extraocular Movements: Extraocular movements intact.     Pupils: Pupils are equal, round, and reactive to light.  Cardiovascular:  Rate and Rhythm: Normal rate and regular rhythm.     Pulses: Normal pulses.     Heart sounds: Normal heart sounds.  Pulmonary:     Effort: Pulmonary effort is normal.     Breath sounds: Normal breath sounds.  Abdominal:     General: Bowel sounds are normal. There is no distension.     Palpations: There is no mass.     Tenderness: There is no abdominal tenderness.     Hernia: No hernia is present.     Comments: Rectus diastasis   Musculoskeletal:     Right lower leg: No edema.     Left lower leg: No edema.  Lymphadenopathy:     Cervical: No cervical adenopathy.  Skin:    General: Skin is warm.  Neurological:     General: No focal deficit present.     Mental Status: He is alert.     Deep Tendon Reflexes:     Reflex  Scores:      Bicep reflexes are 2+ on the right side and 2+ on the left side.      Patellar reflexes are 2+ on the right side and 2+ on the left side.    Comments: Bilateral upper and lower extremity strength 5/5  Psychiatric:        Mood and Affect: Mood normal.        Behavior: Behavior normal.        Thought Content: Thought content normal.        Judgment: Judgment normal.      No results found for any visits on 05/08/24.    The ASCVD Risk score (Arnett DK, et al., 2019) failed to calculate for the following reasons:   Risk score cannot be calculated because patient has a medical history suggesting prior/existing ASCVD    Assessment & Plan:   Problem List Items Addressed This Visit       Cardiovascular and Mediastinum   Intermittent complete heart block [facility])   Patient currently followed by cardiology with pacemaker in place.  Continue following up with specialist as recommended      HTN (hypertension)   History of the same not currently on any antihypertensive medications.  Historically blood pressure has been good elevated on initial recheck today.  Will have patient follow-up closely in 1 month to recheck blood pressure.      Relevant Orders   CBC with Differential/Platelet   Comprehensive metabolic panel with GFR   TSH     Digestive   GERD (gastroesophageal reflux disease)   History of the same currently maintained on omeprazole 20 mg daily.  Patient tolerates medication well and is effective.  Continue        Other   HLD (hyperlipidemia)   History of the same.  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  Pending lipid panel today.  Currently maintained on atorvastatin  40 mg daily      Relevant Orders   Lipid panel   Preventative health care - Primary   Discussed age-appropriate immunizations and screening exams.  Did review patient's personal, surgical, social, family histories.  Patient up-to-date on all age-appropriate vaccinations.  Update flu vaccine  today.  Patient was scheduled for tetanus vaccine with vaccine clinic.  Patient up-to-date on CRC screening.  PSA for prostate cancer screening today.  Patient was given information at discharge about preventative healthcare maintenance with anticipatory guidance.      Relevant Orders   CBC with Differential/Platelet   Comprehensive metabolic  panel with GFR   TSH   GAD (generalized anxiety disorder)   Currently maintained on Klonopin  1 mg daily as needed.  Stable patient denies HI/SI/AVH.  Continue medication as prescribed      History of gout   Currently maintained on allopurinol  100 mg daily.  Pending CMP and uric acid level.      Relevant Orders   Uric acid   Sleep disturbance   Currently maintained on 25 mg of trazodone  nightly.  Does well.  Patient gets 6 hours of sleep with medication.  Continue      Relevant Orders   TSH   Other Visit Diagnoses       Screening for bladder cancer       Relevant Orders   Urine Microscopic     Encounter for immunization       Relevant Orders   Flu vaccine HIGH DOSE PF(Fluzone Trivalent) (Completed)     Screening for diabetes mellitus       Relevant Orders   Hemoglobin A1c     Screening for prostate cancer       Relevant Orders   PSA, Medicare       Return in about 4 weeks (around 06/05/2024) for BP recheck.    Adina Crandall, NP

## 2024-05-08 NOTE — Assessment & Plan Note (Signed)
 Currently maintained on allopurinol  100 mg daily.  Pending CMP and uric acid level.

## 2024-05-08 NOTE — Assessment & Plan Note (Signed)
 History of the same.  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  Pending lipid panel today.  Currently maintained on atorvastatin  40 mg daily

## 2024-05-08 NOTE — Assessment & Plan Note (Signed)
 History of the same currently maintained on omeprazole 20 mg daily.  Patient tolerates medication well and is effective.  Continue

## 2024-05-08 NOTE — Assessment & Plan Note (Signed)
 History of the same not currently on any antihypertensive medications.  Historically blood pressure has been good elevated on initial recheck today.  Will have patient follow-up closely in 1 month to recheck blood pressure.

## 2024-05-08 NOTE — Assessment & Plan Note (Signed)
 Patient currently followed by cardiology with pacemaker in place.  Continue following up with specialist as recommended

## 2024-05-09 LAB — PSA, MEDICARE: PSA: 0.28 ng/mL (ref 0.10–4.00)

## 2024-05-09 LAB — CBC WITH DIFFERENTIAL/PLATELET
Basophils Absolute: 0.1 K/uL (ref 0.0–0.1)
Basophils Relative: 1.2 % (ref 0.0–3.0)
Eosinophils Absolute: 0.2 K/uL (ref 0.0–0.7)
Eosinophils Relative: 2.4 % (ref 0.0–5.0)
HCT: 37.8 % — ABNORMAL LOW (ref 39.0–52.0)
Hemoglobin: 12.6 g/dL — ABNORMAL LOW (ref 13.0–17.0)
Lymphocytes Relative: 20.6 % (ref 12.0–46.0)
Lymphs Abs: 1.9 K/uL (ref 0.7–4.0)
MCHC: 33.3 g/dL (ref 30.0–36.0)
MCV: 90.5 fl (ref 78.0–100.0)
Monocytes Absolute: 0.6 K/uL (ref 0.1–1.0)
Monocytes Relative: 6.6 % (ref 3.0–12.0)
Neutro Abs: 6.4 K/uL (ref 1.4–7.7)
Neutrophils Relative %: 69.2 % (ref 43.0–77.0)
Platelets: 230 K/uL (ref 150.0–400.0)
RBC: 4.18 Mil/uL — ABNORMAL LOW (ref 4.22–5.81)
RDW: 13.9 % (ref 11.5–15.5)
WBC: 9.2 K/uL (ref 4.0–10.5)

## 2024-05-09 LAB — LIPID PANEL
Cholesterol: 107 mg/dL (ref 0–200)
HDL: 35.9 mg/dL — ABNORMAL LOW (ref >39.00)
LDL Cholesterol: 50 mg/dL (ref 0–99)
NonHDL: 70.69
Total CHOL/HDL Ratio: 3
Triglycerides: 101 mg/dL (ref 0.0–149.0)
VLDL: 20.2 mg/dL (ref 0.0–40.0)

## 2024-05-09 LAB — COMPREHENSIVE METABOLIC PANEL WITH GFR
ALT: 12 U/L (ref 0–53)
AST: 14 U/L (ref 0–37)
Albumin: 4.2 g/dL (ref 3.5–5.2)
Alkaline Phosphatase: 66 U/L (ref 39–117)
BUN: 24 mg/dL — ABNORMAL HIGH (ref 6–23)
CO2: 28 meq/L (ref 19–32)
Calcium: 9.3 mg/dL (ref 8.4–10.5)
Chloride: 103 meq/L (ref 96–112)
Creatinine, Ser: 1.25 mg/dL (ref 0.40–1.50)
GFR: 55.5 mL/min — ABNORMAL LOW (ref >60.00)
Glucose, Bld: 141 mg/dL — ABNORMAL HIGH (ref 70–99)
Potassium: 3.9 meq/L (ref 3.5–5.1)
Sodium: 140 meq/L (ref 135–145)
Total Bilirubin: 0.7 mg/dL (ref 0.2–1.2)
Total Protein: 6.8 g/dL (ref 6.0–8.3)

## 2024-05-09 LAB — HEMOGLOBIN A1C: Hgb A1c MFr Bld: 6.5 % (ref 4.6–6.5)

## 2024-05-09 LAB — URIC ACID: Uric Acid, Serum: 5.5 mg/dL (ref 4.0–7.8)

## 2024-05-09 LAB — TSH: TSH: 1.18 u[IU]/mL (ref 0.35–5.50)

## 2024-05-12 ENCOUNTER — Ambulatory Visit: Payer: Self-pay | Admitting: Nurse Practitioner

## 2024-05-12 DIAGNOSIS — E119 Type 2 diabetes mellitus without complications: Secondary | ICD-10-CM | POA: Insufficient documentation

## 2024-05-21 NOTE — Telephone Encounter (Signed)
 Mailing lab results to pt address on file.

## 2024-05-30 ENCOUNTER — Other Ambulatory Visit: Payer: Self-pay

## 2024-05-30 DIAGNOSIS — Z126 Encounter for screening for malignant neoplasm of bladder: Secondary | ICD-10-CM

## 2024-05-30 NOTE — Addendum Note (Signed)
 Addended by: ISADORA RAISIN on: 05/30/2024 02:22 PM   Modules accepted: Orders

## 2024-05-31 LAB — URINALYSIS, MICROSCOPIC ONLY
Bacteria, UA: NONE SEEN /HPF
RBC / HPF: NONE SEEN /HPF (ref 0–2)
Squamous Epithelial / HPF: NONE SEEN /HPF
WBC, UA: NONE SEEN /HPF (ref 0–5)

## 2024-06-11 ENCOUNTER — Ambulatory Visit (INDEPENDENT_AMBULATORY_CARE_PROVIDER_SITE_OTHER): Admitting: Nurse Practitioner

## 2024-06-11 VITALS — BP 134/78 | HR 67 | Temp 98.0°F | Ht 72.0 in | Wt 208.4 lb

## 2024-06-11 DIAGNOSIS — I10 Essential (primary) hypertension: Secondary | ICD-10-CM | POA: Diagnosis not present

## 2024-06-11 MED ORDER — AMLODIPINE BESYLATE 2.5 MG PO TABS: 2.5000 mg | ORAL_TABLET | Freq: Every day | ORAL | 0 refills | Status: AC

## 2024-06-11 NOTE — Patient Instructions (Signed)
 Nice to see you today I have sent in a blood pressure pill to take daily Follow up with me in 6 weeks Check your blood pressure several times a week at home

## 2024-06-11 NOTE — Progress Notes (Signed)
 "  Established Patient Office Visit  Subjective   Patient ID: Thomas Monroe, male    DOB: 1945-11-09  Age: 79 y.o. MRN: [id number]  Chief Complaint  Patient presents with   Follow-up    Thomas Monroe complains of having High BP in the past couple of days ranging from 140 to 165.     HPI  Discussed the use of AI scribe software for clinical note transcription with the patient, who gave verbal consent to proceed.  History of Present Illness Thomas MCCAMMON is a 79 year old male who presents with elevated blood pressure readings at home.  He has been experiencing elevated systolic blood pressure readings at home, ranging from 140 to 170 mmHg, but is unable to recall the diastolic values. He describes sensations in his neck and headaches when his blood pressure is elevated. He is not currently on any antihypertensive medication.  He feels his blood pressure is elevated when lying down to sleep, which sometimes causes nervousness. He is mindful of his sodium intake, noting that there is no salt in the house and he avoids high-sodium foods. Recent blood work, including sodium levels, thyroid , liver, kidney, and electrolytes, were reported as normal.  He has not experienced chest pain or shortness of breath, but acknowledges headaches when his blood pressure is elevated.    Review of Systems  Constitutional:  Negative for chills and fever.  Respiratory:  Negative for shortness of breath.   Cardiovascular:  Negative for chest pain and leg swelling.  Neurological:  Positive for headaches.      Objective:     BP 134/78   Pulse 67   Temp 98 F (36.7 C) (Oral)   Ht 6' (1.829 m)   Wt 208 lb 6.4 oz (94.5 kg)   SpO2 95%   BMI 28.26 kg/m  BP Readings from Last 3 Encounters:  06/11/24 134/78  05/08/24 (!) 158/76  11/27/23 118/80   Wt Readings from Last 3 Encounters:  06/11/24 208 lb 6.4 oz (94.5 kg)  05/08/24 211 lb (95.7 kg)  11/27/23 211 lb 6.4 oz (95.9 kg)   SpO2 Readings from Last 3  Encounters:  06/11/24 95%  05/08/24 98%  11/27/23 98%                  Physical Exam Vitals and nursing note reviewed.  Constitutional:      Appearance: Normal appearance.  Cardiovascular:     Rate and Rhythm: Normal rate and regular rhythm.     Heart sounds: Normal heart sounds.  Pulmonary:     Effort: Pulmonary effort is normal.     Breath sounds: Normal breath sounds.  Musculoskeletal:     Right lower leg: No edema.     Left lower leg: No edema.  Neurological:     Mental Status: He is alert.      No results found for any visits on 06/11/24.    The ASCVD Risk score (Arnett DK, et al., 2019) failed to calculate for the following reasons:   Risk score cannot be calculated because patient has a medical history suggesting prior/existing ASCVD   * - Cholesterol units were assumed    Assessment & Plan:   Problem List Items Addressed This Visit       Cardiovascular and Mediastinum   HTN (hypertension) - Primary   Relevant Medications   amLODipine  (NORVASC ) 2.5 MG tablet   Assessment and Plan Assessment & Plan Hypertension Consistently elevated blood pressure readings warrant initiation of  antihypertensive therapy. - Initiated amlodipine  2.5 mg once daily at night. - Continue home blood pressure monitoring and report readings. - Scheduled follow-up in six weeks to assess medication response. - Advised to seek medical attention for chest pain, shortness of breath, or persistent headaches.   Return in about 6 weeks (around 07/23/2024) for BP recheck.    Adina Crandall, NP  "

## 2024-07-28 ENCOUNTER — Ambulatory Visit: Admitting: Nurse Practitioner

## 2024-07-30 ENCOUNTER — Ambulatory Visit: Payer: Medicare HMO
